# Patient Record
Sex: Male | Born: 1945 | Race: Black or African American | Hispanic: No | Marital: Married | Smoking: Former smoker
Health system: Southern US, Community
[De-identification: ages and names within clinical notes are randomized; demographics above are authoritative.]

## PROBLEM LIST (undated history)

## (undated) DIAGNOSIS — M1990 Unspecified osteoarthritis, unspecified site: Secondary | ICD-10-CM

## (undated) DIAGNOSIS — I10 Essential (primary) hypertension: Secondary | ICD-10-CM

## (undated) DIAGNOSIS — I2699 Other pulmonary embolism without acute cor pulmonale: Secondary | ICD-10-CM

## (undated) HISTORY — PX: JOINT REPLACEMENT: SHX530

## (undated) HISTORY — PX: OTHER SURGICAL HISTORY: SHX169

## (undated) HISTORY — PX: REPLACEMENT TOTAL KNEE BILATERAL: SUR1225

---

## 2018-04-22 ENCOUNTER — Ambulatory Visit
Admission: EM | Admit: 2018-04-22 | Discharge: 2018-04-22 | Disposition: A | Discharge status: Home / Self Care | Payer: TRICARE For Life (TFL) | Attending: Family Medicine | Admitting: Family Medicine

## 2018-04-22 ENCOUNTER — Emergency Department: Payer: Medicare Other

## 2018-04-22 ENCOUNTER — Other Ambulatory Visit: Payer: Self-pay

## 2018-04-22 ENCOUNTER — Inpatient Hospital Stay (HOSPITAL_COMMUNITY)
Admit: 2018-04-22 | Discharge: 2018-04-22 | Disposition: A | Discharge status: Home / Self Care | Payer: Medicare Other | Attending: Internal Medicine | Admitting: Internal Medicine

## 2018-04-22 ENCOUNTER — Encounter: Payer: Self-pay | Admitting: Emergency Medicine

## 2018-04-22 ENCOUNTER — Inpatient Hospital Stay
Admission: EM | Admit: 2018-04-22 | Discharge: 2018-04-24 | DRG: 175 | Disposition: A | Discharge status: Home / Self Care | Payer: Medicare Other | Attending: Internal Medicine | Admitting: Internal Medicine

## 2018-04-22 DIAGNOSIS — I2609 Other pulmonary embolism with acute cor pulmonale: Secondary | ICD-10-CM | POA: Diagnosis present

## 2018-04-22 DIAGNOSIS — Z880 Allergy status to penicillin: Secondary | ICD-10-CM

## 2018-04-22 DIAGNOSIS — Z79899 Other long term (current) drug therapy: Secondary | ICD-10-CM | POA: Diagnosis not present

## 2018-04-22 DIAGNOSIS — R079 Chest pain, unspecified: Secondary | ICD-10-CM | POA: Diagnosis not present

## 2018-04-22 DIAGNOSIS — I82431 Acute embolism and thrombosis of right popliteal vein: Secondary | ICD-10-CM | POA: Diagnosis present

## 2018-04-22 DIAGNOSIS — R0781 Pleurodynia: Secondary | ICD-10-CM

## 2018-04-22 DIAGNOSIS — M1990 Unspecified osteoarthritis, unspecified site: Secondary | ICD-10-CM

## 2018-04-22 DIAGNOSIS — H409 Unspecified glaucoma: Secondary | ICD-10-CM | POA: Diagnosis present

## 2018-04-22 DIAGNOSIS — R071 Chest pain on breathing: Secondary | ICD-10-CM | POA: Diagnosis not present

## 2018-04-22 DIAGNOSIS — I2699 Other pulmonary embolism without acute cor pulmonale: Secondary | ICD-10-CM | POA: Diagnosis not present

## 2018-04-22 DIAGNOSIS — I10 Essential (primary) hypertension: Secondary | ICD-10-CM | POA: Diagnosis present

## 2018-04-22 DIAGNOSIS — Z86711 Personal history of pulmonary embolism: Secondary | ICD-10-CM

## 2018-04-22 DIAGNOSIS — Z881 Allergy status to other antibiotic agents status: Secondary | ICD-10-CM

## 2018-04-22 DIAGNOSIS — R1011 Right upper quadrant pain: Secondary | ICD-10-CM | POA: Diagnosis not present

## 2018-04-22 HISTORY — DX: Essential (primary) hypertension: I10

## 2018-04-22 HISTORY — DX: Other pulmonary embolism without acute cor pulmonale: I26.99

## 2018-04-22 HISTORY — DX: Unspecified osteoarthritis, unspecified site: M19.90

## 2018-04-22 LAB — CBC
HCT: 46.2 % (ref 39.0–52.0)
Hemoglobin: 15.3 g/dL (ref 13.0–17.0)
MCH: 30.1 pg (ref 26.0–34.0)
MCHC: 33.1 g/dL (ref 30.0–36.0)
MCV: 90.9 fL (ref 80.0–100.0)
NRBC: 0 % (ref 0.0–0.2)
PLATELETS: 170 10*3/uL (ref 150–400)
RBC: 5.08 MIL/uL (ref 4.22–5.81)
RDW: 12.3 % (ref 11.5–15.5)
WBC: 6.6 10*3/uL (ref 4.0–10.5)

## 2018-04-22 LAB — COMPREHENSIVE METABOLIC PANEL
ALK PHOS: 73 U/L (ref 38–126)
ALT: 17 U/L (ref 0–44)
ANION GAP: 11 (ref 5–15)
AST: 18 U/L (ref 15–41)
Albumin: 4.4 g/dL (ref 3.5–5.0)
BILIRUBIN TOTAL: 1.1 mg/dL (ref 0.3–1.2)
BUN: 15 mg/dL (ref 8–23)
CALCIUM: 9.6 mg/dL (ref 8.9–10.3)
CO2: 28 mmol/L (ref 22–32)
CREATININE: 0.97 mg/dL (ref 0.61–1.24)
Chloride: 99 mmol/L (ref 98–111)
Glucose, Bld: 105 mg/dL — ABNORMAL HIGH (ref 70–99)
Potassium: 4.2 mmol/L (ref 3.5–5.1)
Sodium: 138 mmol/L (ref 135–145)
TOTAL PROTEIN: 8.5 g/dL — AB (ref 6.5–8.1)

## 2018-04-22 LAB — HEPARIN LEVEL (UNFRACTIONATED)
Heparin Unfractionated: 1.2 IU/mL — ABNORMAL HIGH (ref 0.30–0.70)
Heparin Unfractionated: 1.13 IU/mL — ABNORMAL HIGH (ref 0.30–0.70)

## 2018-04-22 LAB — PROTIME-INR
INR: 1.07
Prothrombin Time: 13.8 seconds (ref 11.4–15.2)

## 2018-04-22 LAB — TROPONIN I: Troponin I: <0.03 ng/mL (ref <0.03)

## 2018-04-22 LAB — LIPASE, BLOOD: Lipase: 32 U/L (ref 11–51)

## 2018-04-22 LAB — BRAIN NATRIURETIC PEPTIDE: B Natriuretic Peptide: 10 pg/mL (ref 0.0–100.0)

## 2018-04-22 LAB — APTT: APTT: 27 s (ref 24–36)

## 2018-04-22 MED ORDER — SODIUM CHLORIDE 0.9 % IV BOLUS
1000.0000 mL | Freq: Once | INTRAVENOUS | Status: AC
  Administered 2018-04-22: 1000 mL via INTRAVENOUS

## 2018-04-22 MED ORDER — OXYCODONE-ACETAMINOPHEN 5-325 MG PO TABS
1.0000 | ORAL_TABLET | ORAL | Status: DC | PRN
  Administered 2018-04-22: 1 via ORAL
  Filled 2018-04-22: qty 1

## 2018-04-22 MED ORDER — OXYCODONE-ACETAMINOPHEN 5-325 MG PO TABS
1.0000 | ORAL_TABLET | ORAL | Status: DC | PRN
  Administered 2018-04-22 – 2018-04-24 (×5): 1 via ORAL
  Filled 2018-04-22 (×5): qty 1

## 2018-04-22 MED ORDER — LATANOPROST 0.005 % OP SOLN
1.0000 [drp] | Freq: Every day | OPHTHALMIC | Status: DC
  Administered 2018-04-22 – 2018-04-23 (×2): 1 [drp] via OPHTHALMIC
  Filled 2018-04-22 (×2): qty 2.5

## 2018-04-22 MED ORDER — ONDANSETRON HCL 4 MG/2ML IJ SOLN
4.0000 mg | Freq: Once | INTRAMUSCULAR | Status: AC
  Administered 2018-04-22: 4 mg via INTRAVENOUS
  Filled 2018-04-22 (×2): qty 2

## 2018-04-22 MED ORDER — DOCUSATE SODIUM 100 MG PO CAPS: 100.0000 mg | ORAL_CAPSULE | Freq: Two times a day (BID) | ORAL | Status: DC | PRN

## 2018-04-22 MED ORDER — HEPARIN BOLUS VIA INFUSION
5000.0000 [IU] | Freq: Once | INTRAVENOUS | Status: AC
  Administered 2018-04-22: 5000 [IU] via INTRAVENOUS
  Filled 2018-04-22: qty 5000

## 2018-04-22 MED ORDER — HEPARIN (PORCINE) IN NACL 100-0.45 UNIT/ML-% IJ SOLN
1500.0000 [IU]/h | INTRAMUSCULAR | Status: DC
  Administered 2018-04-22: 1500 [IU]/h via INTRAVENOUS
  Filled 2018-04-22 (×2): qty 250

## 2018-04-22 MED ORDER — POLYVINYL ALCOHOL 1.4 % OP SOLN
1.0000 [drp] | OPHTHALMIC | Status: DC | PRN
  Filled 2018-04-22: qty 15

## 2018-04-22 MED ORDER — MORPHINE SULFATE (PF) 4 MG/ML IV SOLN
INTRAVENOUS | Status: AC
  Filled 2018-04-22: qty 1

## 2018-04-22 MED ORDER — HEPARIN (PORCINE) IN NACL 100-0.45 UNIT/ML-% IJ SOLN
1200.0000 [IU]/h | INTRAMUSCULAR | Status: DC
  Administered 2018-04-22 – 2018-04-24 (×3): 1200 [IU]/h via INTRAVENOUS
  Filled 2018-04-22 (×2): qty 250

## 2018-04-22 MED ORDER — SENNOSIDES-DOCUSATE SODIUM 8.6-50 MG PO TABS
1.0000 | ORAL_TABLET | Freq: Two times a day (BID) | ORAL | Status: DC
  Administered 2018-04-22 – 2018-04-24 (×4): 1 via ORAL
  Filled 2018-04-22 (×4): qty 1

## 2018-04-22 MED ORDER — IOPAMIDOL (ISOVUE-370) INJECTION 76%
75.0000 mL | Freq: Once | INTRAVENOUS | Status: AC | PRN
  Administered 2018-04-22: 75 mL via INTRAVENOUS

## 2018-04-22 MED ORDER — MORPHINE SULFATE (PF) 4 MG/ML IV SOLN
4.0000 mg | INTRAVENOUS | Status: DC | PRN
  Administered 2018-04-22: 4 mg via INTRAVENOUS
  Filled 2018-04-22 (×2): qty 1

## 2018-04-22 MED ORDER — MORPHINE SULFATE (PF) 4 MG/ML IV SOLN
4.0000 mg | INTRAVENOUS | Status: DC | PRN
  Administered 2018-04-22 – 2018-04-23 (×2): 4 mg via INTRAVENOUS
  Filled 2018-04-22 (×2): qty 1

## 2018-04-22 NOTE — ED Notes (Signed)
Pt c/o RUQ pain x1 day - he reports pain increases with deep inspiration or sneezing - pt denies any injury Denies N/V/D

## 2018-04-22 NOTE — H&P (Signed)
[facility] - [place] at [facility]   PATIENT NAME: Tyrone Coffey    MR#:  [id number]  DATE OF BIRTH:  05-31-1946  DATE OF ADMISSION:  04/22/2018  PRIMARY CARE PHYSICIAN: Tammi Klippel, MD   REQUESTING/REFERRING PHYSICIAN: Roxan Hockey  CHIEF COMPLAINT:   Chief Complaint  Patient presents with  . Abdominal Pain    HISTORY OF PRESENT ILLNESS: Tyrone Coffey  is a 72 y.o. male with a known history of arthritis, hypertension, pulmonary embolism in 2013 and no provoking reasons were found at that time-was on Coumadin for 3 to 4 months following that and stopped after that.  Had regular colonoscopies and last was 1 year ago without any significant findings. He had pleuritic chest pain on the right side for last 2 to 3 days along with some cough-so he went to urgent care center today and they sent to the hospital due to history of pulmonary embolism. CT scan angiogram was done which showed multiple pulmonary emboli in both lungs with right heart strain.  ER physician started on heparin drip and advised to medical services. On further questioning patient denies any family history of blood clots, denies any recent surgery or long-term travel or driving.  He confirms that he is "couch potato" sits and watch TV 3 to 4 hours every day without getting up in between.  PAST MEDICAL HISTORY:   Past Medical History:  Diagnosis Date  . Arthritis   . Hypertension   . Pulmonary embolism (HCC)     PAST SURGICAL HISTORY:  Past Surgical History:  Procedure Laterality Date  . JOINT REPLACEMENT      SOCIAL HISTORY:  Social History   Tobacco Use  . Smoking status: Never Smoker  . Smokeless tobacco: Never Used  Substance Use Topics  . Alcohol use: Yes    FAMILY HISTORY:  Family History  Problem Relation Age of Onset  . Lung cancer Brother     DRUG ALLERGIES:  Allergies  Allergen Reactions  . Amoxicillin Nausea Only    Has patient had a PCN reaction causing immediate  rash, facial/tongue/throat swelling, SOB or lightheadedness with hypotension: No Has patient had a PCN reaction causing severe rash involving mucus membranes or skin necrosis: No Has patient had a PCN reaction that required hospitalization: No Has patient had a PCN reaction occurring within the last 10 years: Yes If all of the above answers are "NO", then may proceed with Cephalosporin use.    REVIEW OF SYSTEMS:   CONSTITUTIONAL: No fever, fatigue or weakness.  EYES: No blurred or double vision.  EARS, NOSE, AND THROAT: No tinnitus or ear pain.  RESPIRATORY: Have cough, shortness of breath, no wheezing or hemoptysis.  CARDIOVASCULAR: Right-sided chest pain, no orthopnea, edema.  GASTROINTESTINAL: No nausea, vomiting, diarrhea or abdominal pain.  GENITOURINARY: No dysuria, hematuria.  ENDOCRINE: No polyuria, nocturia,  HEMATOLOGY: No anemia, easy bruising or bleeding SKIN: No rash or lesion. MUSCULOSKELETAL: No joint pain or arthritis.   NEUROLOGIC: No tingling, numbness, weakness.  PSYCHIATRY: No anxiety or depression.   MEDICATIONS AT HOME:  Prior to Admission medications   Medication Sig Start Date End Date Taking? Authorizing Provider  Dextran 70-Hypromellose (ARTIFICIAL TEARS) 0.1-0.3 % SOLN Place 1 drop into both eyes every 4 (four) hours as needed (for dry eyes).    Yes [provider]  latanoprost (XALATAN) 0.005 % ophthalmic solution Place 1 drop into the right eye at bedtime.    Yes [provider]  lisinopril-hydrochlorothiazide (PRINZIDE,ZESTORETIC) 10-12.5 MG  tablet Take 1 tablet by mouth daily.    Yes [provider]      PHYSICAL EXAMINATION:   VITAL SIGNS: Blood pressure (!) 130/99, pulse 84, temperature 98.2 F (36.8 C), temperature source Oral, resp. rate (!) 23, height 5\' 11"  (1.803 m), weight 86.2 kg, SpO2 95 %.  GENERAL:  72 y.o.-year-old patient lying in the bed with no acute distress.  EYES: Pupils equal, round, reactive to light  and accommodation. No scleral icterus. Extraocular muscles intact.  HEENT: Head atraumatic, normocephalic. Oropharynx and nasopharynx clear.  NECK:  Supple, no jugular venous distention. No thyroid enlargement, no tenderness.  LUNGS: Normal breath sounds bilaterally, no wheezing, rales,rhonchi or crepitation. No use of accessory muscles of respiration.  CARDIOVASCULAR: S1, S2 normal. No murmurs, rubs, or gallops.  ABDOMEN: Soft, nontender, nondistended. Bowel sounds present. No organomegaly or mass.  EXTREMITIES: No pedal edema, cyanosis, or clubbing.  NEUROLOGIC: Cranial nerves II through XII are intact. Muscle strength 5/5 in all extremities. Sensation intact. Gait not checked.  PSYCHIATRIC: The patient is alert and oriented x 3.  SKIN: No obvious rash, lesion, or ulcer.   LABORATORY PANEL:   CBC Recent Labs  Lab 04/22/18 0950  WBC 6.6  HGB 15.3  HCT 46.2  PLT 170  MCV 90.9  MCH 30.1  MCHC 33.1  RDW 12.3   ------------------------------------------------------------------------------------------------------------------  Chemistries  Recent Labs  Lab 04/22/18 0950  NA 138  K 4.2  CL 99  CO2 28  GLUCOSE 105*  BUN 15  CREATININE 0.97  CALCIUM 9.6  AST 18  ALT 17  ALKPHOS 73  BILITOT 1.1   ------------------------------------------------------------------------------------------------------------------ estimated creatinine clearance is 74.4 mL/min (by C-G formula based on SCr of 0.97 mg/dL). ------------------------------------------------------------------------------------------------------------------ No results for input(s): TSH, T4TOTAL, T3FREE, THYROIDAB in the last 72 hours.  Invalid input(s): FREET3   Coagulation profile Recent Labs  Lab 04/22/18 1145  INR 1.07   ------------------------------------------------------------------------------------------------------------------- No results for input(s): DDIMER in the last 72  hours. -------------------------------------------------------------------------------------------------------------------  Cardiac Enzymes Recent Labs  Lab 04/22/18 0950 04/22/18 1145  TROPONINI <0.03 <0.03   ------------------------------------------------------------------------------------------------------------------ Invalid input(s): POCBNP  ---------------------------------------------------------------------------------------------------------------  Urinalysis No results found for: COLORURINE, APPEARANCEUR, LABSPEC, PHURINE, GLUCOSEU, HGBUR, BILIRUBINUR, KETONESUR, PROTEINUR, UROBILINOGEN, NITRITE, LEUKOCYTESUR   RADIOLOGY: Ct Angio Chest Pe W And/or Wo Contrast  Result Date: 04/22/2018 CLINICAL DATA:  Shortness of breath. Right upper quadrant abdomen pain. Clinical concern for pulmonary embolism. EXAM: CT ANGIOGRAPHY CHEST WITH CONTRAST TECHNIQUE: Multidetector CT imaging of the chest was performed using the standard protocol during bolus administration of intravenous contrast. Multiplanar CT image reconstructions and MIPs were obtained to evaluate the vascular anatomy. CONTRAST:  75mL ISOVUE-370 IOPAMIDOL (ISOVUE-370) INJECTION 76% COMPARISON:  Portable chest obtained earlier today. FINDINGS: Cardiovascular: Multiple moderate-sized bilateral pulmonary arterial filling defects. The right ventricular to left ventricular ratio is 1.2. Borderline enlarged heart. No pericardial effusion seen. Mediastinum/Nodes: No enlarged mediastinal, hilar, or axillary lymph nodes. Thyroid gland, trachea, and esophagus demonstrate no significant findings. Small hiatal hernia. Lungs/Pleura: Mild bilateral lower lobe dependent atelectasis. No pleural fluid. Upper Abdomen: Bilateral renal cysts. Musculoskeletal: Thoracic spine degenerative changes. Right shoulder prosthesis. Review of the MIP images confirms the above findings. IMPRESSION: 1. Positive for acute PE with CT evidence of right heart strain  (RV/LV Ratio = 1.2) consistent with at least submassive (intermediate risk) PE. The presence of right heart strain has been associated with an increased risk of morbidity and mortality. Please activate Code PE by paging [phone]. 2. Small hiatal hernia. Critical  Value/emergent results were called by telephone at the time of interpretation on 04/22/2018 at 11:13 am to Dr. Willy Eddy , who verbally acknowledged these results. Electronically Signed   By: Beckie Salts M.D.   On: 04/22/2018 11:14   Dg Chest Portable 1 View  Result Date: 04/22/2018 CLINICAL DATA:  Right side chest pain. EXAM: PORTABLE CHEST 1 VIEW COMPARISON:  None. FINDINGS: Cardiomegaly with vascular congestion. Low lung volumes with bibasilar atelectasis. No visible significant effusions or acute bony abnormality. IMPRESSION: Low lung volumes with bibasilar atelectasis. Mild cardiomegaly and vascular congestion. Electronically Signed   By: Charlett Nose M.D.   On: 04/22/2018 10:30    EKG: Orders placed or performed during the hospital encounter of 04/22/18  . EKG 12-Lead  . EKG 12-Lead    IMPRESSION AND PLAN:  *Acute multiple pulmonary emboli This is unprovoked.  Started on heparin IV drip, hematology consult.  *Right heart strain Will get echocardiogram.  *Pleuritic chest pain Due to pulmonary emboli Helped with IV morphine in ER, will start on oral Percocet.  *Hypertension As currently blood pressure running normal and he had right heart strain, I will hold antihypertensive medications.  We may resume once the blood pressure started going up.  All the records are reviewed and case discussed with ED provider. Management plans discussed with the patient, family and they are in agreement.  CODE STATUS: Full code.  Patient's wife was present in the room during my visit explained her and patient in detail about causes and treatment options for pulmonary emboli.  TOTAL TIME TAKING CARE OF THIS PATIENT: 45  minutes.    Altamese Dilling M.D on 04/22/2018   Between 7am to 6pm - Pager - [phone]  After 6pm go to [email or web address] - Social research officer, government  [facility]  Office  [phone]  CC: Primary care physician; Sotolongo, Christiana Pellant, MD   Note: This dictation was prepared with Dragon dictation along with smaller phrase technology. Any transcriptional errors that result from this process are unintentional.

## 2018-04-22 NOTE — Progress Notes (Signed)
ANTICOAGULATION CONSULT NOTE - Initial Consult  Pharmacy Consult for Heparin Drip Indication: VTE treatment  Allergies  Allergen Reactions  . Amoxicillin Nausea Only    Has patient had a PCN reaction causing immediate rash, facial/tongue/throat swelling, SOB or lightheadedness with hypotension: No Has patient had a PCN reaction causing severe rash involving mucus membranes or skin necrosis: No Has patient had a PCN reaction that required hospitalization: No Has patient had a PCN reaction occurring within the last 10 years: Yes If all of the above answers are "NO", then may proceed with Cephalosporin use.    Patient Measurements: Height: 5\' 11"  (180.3 cm) Weight: 198 lb 10.2 oz (90.1 kg) IBW/kg (Calculated) : 75.3 Heparin Dosing Weight: 86.2 kg  Vital Signs: Temp: 98.7 F (37.1 C) (10/30 1946) Temp Source: Oral (10/30 1946) BP: 118/89 (10/30 1946) Pulse Rate: 64 (10/30 1946)  Labs: Recent Labs    04/22/18 0950 04/22/18 1145 04/22/18 1951  HGB 15.3  --   --   HCT 46.2  --   --   PLT 170  --   --   APTT  --  27  --   LABPROT  --  13.8  --   INR  --  1.07  --   HEPARINUNFRC  --   --  1.20*  CREATININE 0.97  --   --   TROPONINI <0.03 <0.03  --     Estimated Creatinine Clearance: 74.4 mL/min (by C-G formula based on SCr of 0.97 mg/dL).   Medical History: Past Medical History:  Diagnosis Date  . Arthritis   . Hypertension   . Pulmonary embolism (HCC)     Medications:  Scheduled:  . latanoprost  1 drop Right Eye QHS  . morphine      . senna-docusate  1 tablet Oral BID   Infusions:  . heparin 1,500 Units/hr (04/22/18 2002)    Assessment: Pharmacy consulted to dose and monitor heparin drip for this 72 yo male presenting to ED with c/o upper abdominal pain that started yesterday.    Goal of Therapy:  Heparin level 0.3-0.7 units/ml Monitor platelets by anticoagulation protocol: Yes   Plan:  Give 5000 units bolus x 1 Start heparin infusion at 1500  units/hr Check anti-Xa level in 8 hours and daily while on heparin Continue to monitor H&H and platelets  HL ordered for tonight at 20:00.   10/30:  HL @ 20:00 = 1.2 Suspect this may be lab error or sample was drawn incorrectly.   Will repeat HL on 10/30 @ 21:45.  Kalyan Barabas D, RPH 04/22/2018,9:35 PM

## 2018-04-22 NOTE — ED Triage Notes (Signed)
Pt c/o RUQ pain since yesterday with , hurts with deep breathing . Denies injury. Denies N/V/D. Pt appears to be uncomfortable, grimacing.

## 2018-04-22 NOTE — ED Provider Notes (Signed)
[facility]    CSN: [id number] Arrival date & time: 04/22/18  0802     History   Chief Complaint Chief Complaint  Patient presents with  . Abdominal Pain    HPI Tyrone Coffey is a 72 y.o. male.   72 yo male with a c/o progressively worsening right upper quadrant pain since yesterday. Patient states pain is worse when taking deep breaths and feels slight shortness of breath. Patient denies any nausea, vomiting, fevers, chills. Patient has a h/o pulmonary embolism in 2013.   The history is provided by the patient.    Past Medical History:  Diagnosis Date  . Arthritis   . Hypertension   . Pulmonary embolism (HCC)     There are no active problems to display for this patient.   Past Surgical History:  Procedure Laterality Date  . JOINT REPLACEMENT         Home Medications    Prior to Admission medications   Medication Sig Start Date End Date Taking? Authorizing Provider  lisinopril-hydrochlorothiazide (PRINZIDE,ZESTORETIC) 10-12.5 MG tablet Take by mouth. 01/09/18  Yes [provider]  Dextran 70-Hypromellose (ARTIFICIAL TEARS) 0.1-0.3 % SOLN 1 drop every 4 (four) hours as needed for Dry Eyes.    [provider]    Family History History reviewed. No pertinent family history.  Social History Social History   Tobacco Use  . Smoking status: Never Smoker  . Smokeless tobacco: Never Used  Substance Use Topics  . Alcohol use: Yes  . Drug use: Never     Allergies   Amoxicillin   Review of Systems Review of Systems   Physical Exam Triage Vital Signs ED Triage Vitals  Enc Vitals Group     BP 04/22/18 0812 (!) 137/104     Pulse Rate 04/22/18 0812 90     Resp 04/22/18 0812 18     Temp 04/22/18 0812 98.2 F (36.8 C)     Temp Source 04/22/18 0812 Oral     SpO2 04/22/18 0812 97 %     Weight 04/22/18 0815 190 lb (86.2 kg)     Height 04/22/18 0815 5\' 11"  (1.803 m)     Head Circumference --      Peak Flow --      Pain  Score 04/22/18 0815 9     Pain Loc --      Pain Edu? --      Excl. in GC? --    No data found.  Updated Vital Signs BP (!) 137/104 (BP Location: Left Arm)   Pulse 90   Temp 98.2 F (36.8 C) (Oral)   Resp 18   Ht 5\' 11"  (1.803 m)   Wt 86.2 kg   SpO2 97%   BMI 26.50 kg/m   Visual Acuity Right Eye Distance:   Left Eye Distance:   Bilateral Distance:    Right Eye Near:   Left Eye Near:    Bilateral Near:     Physical Exam  Constitutional: He is oriented to person, place, and time. He appears well-developed and well-nourished. No distress.  HENT:  Head: Normocephalic and atraumatic.  Cardiovascular: Normal rate, regular rhythm, normal heart sounds and intact distal pulses.  No murmur heard. Pulmonary/Chest: Effort normal and breath sounds normal. No stridor. No respiratory distress. He has no wheezes. He has no rales.  Abdominal: Soft. Bowel sounds are normal. He exhibits no distension and no mass. There is tenderness (right upper quadrant). There is rebound. There is no guarding.  Neurological: He is alert and oriented to person, place, and time.  Skin: No rash noted. He is not diaphoretic.  Nursing note and vitals reviewed.    UC Treatments / Results  Labs (all labs ordered are listed, but only abnormal results are displayed) Labs Reviewed - No data to display  EKG None  Radiology No results found.  Procedures Procedures (including critical care time)  Medications Ordered in UC Medications - No data to display  Initial Impression / Assessment and Plan / UC Course  I have reviewed the triage vital signs and the nursing notes.  Pertinent labs & imaging results that were available during my care of the patient were reviewed by me and considered in my medical decision making (see chart for details).      Final Clinical Impressions(s) / UC Diagnoses   Final diagnoses:  Pleuritic chest pain  Abdominal pain, right upper quadrant  History of pulmonary  embolism     Discharge Instructions     Recommend patient go to Emergency Department for further evaluation and management    ED Prescriptions    None      1. Possible diagnoses reviewed with patient;explained to patient recommend he go to Emergency Department for further evaluation and management   Controlled Substance Prescriptions [place] Controlled Substance Registry consulted? Not Applicable   Payton Mccallum, MD 04/22/18 [phone]

## 2018-04-22 NOTE — ED Notes (Signed)
Attending made aware in moving and intensified pain and respiration rate

## 2018-04-22 NOTE — Discharge Instructions (Signed)
Recommend patient go to Emergency Department for further evaluation and management °

## 2018-04-22 NOTE — ED Notes (Signed)
First Nurse Note: Patient states he was sent from [facility].  Complaining of right sided abdominal pain starting yesterday PM.  Splinting his side.  Assisted into WC.

## 2018-04-22 NOTE — ED Triage Notes (Signed)
Patient c/o upper abdominal pain that started yesterday. Patient stated when he takes a deep breath the pain intensifies. Patient reports he has had a blood clot before in the past.

## 2018-04-22 NOTE — ED Notes (Signed)
Pt c/o increased pain with inspiration. O2 93% at this time. Attending paged.

## 2018-04-22 NOTE — Progress Notes (Signed)
ANTICOAGULATION CONSULT NOTE - Initial Consult  Pharmacy Consult for Heparin Drip Indication: VTE treatment  Allergies  Allergen Reactions  . Amoxicillin Nausea Only    Has patient had a PCN reaction causing immediate rash, facial/tongue/throat swelling, SOB or lightheadedness with hypotension: No Has patient had a PCN reaction causing severe rash involving mucus membranes or skin necrosis: No Has patient had a PCN reaction that required hospitalization: No Has patient had a PCN reaction occurring within the last 10 years: Yes If all of the above answers are "NO", then may proceed with Cephalosporin use.    Patient Measurements: Height: 5\' 11"  (180.3 cm) Weight: 198 lb 10.2 oz (90.1 kg) IBW/kg (Calculated) : 75.3 Heparin Dosing Weight: 86.2 kg  Vital Signs: Temp: 98.7 F (37.1 C) (10/30 1946) Temp Source: Oral (10/30 1946) BP: 118/89 (10/30 1946) Pulse Rate: 64 (10/30 1946)  Labs: Recent Labs    04/22/18 0950 04/22/18 1145 04/22/18 1951 04/22/18 2148  HGB 15.3  --   --   --   HCT 46.2  --   --   --   PLT 170  --   --   --   APTT  --  27  --   --   LABPROT  --  13.8  --   --   INR  --  1.07  --   --   HEPARINUNFRC  --   --  1.20* 1.13*  CREATININE 0.97  --   --   --   TROPONINI <0.03 <0.03  --   --     Estimated Creatinine Clearance: 74.4 mL/min (by C-G formula based on SCr of 0.97 mg/dL).   Medical History: Past Medical History:  Diagnosis Date  . Arthritis   . Hypertension   . Pulmonary embolism (HCC)     Medications:  Scheduled:  . latanoprost  1 drop Right Eye QHS  . morphine      . senna-docusate  1 tablet Oral BID   Infusions:  . [START ON 04/23/2018] heparin      Assessment: Pharmacy consulted to dose and monitor heparin drip for this 72 yo male presenting to ED with c/o upper abdominal pain that started yesterday.    Goal of Therapy:  Heparin level 0.3-0.7 units/ml Monitor platelets by anticoagulation protocol: Yes   Plan:  Give 5000  units bolus x 1 Start heparin infusion at 1500 units/hr Check anti-Xa level in 8 hours and daily while on heparin Continue to monitor H&H and platelets  HL ordered for tonight at 20:00.   10/30:  HL @ 20:00 = 1.2 Suspect this may be lab error or sample was drawn incorrectly.   Will repeat HL on 10/30 @ 21:45. Will hold Heparin gtt for 1 hr pending lab redraw.  Called RN and told her to stop heparin gtt.  Will decrease dose to 1200 units/hr if 2nd draw validates 1st lab result.  If 2nd HL is therapeutic resume heparin gtt @ 1500 units/hr.   10/30: HL @ 21:48 = 1.13 Will restart heparin gtt @ 1200 units/hr on 10/31 @ 00:00. Will draw next HL on 10/31 @ 0800.   Sumit Branham D, RPH 04/22/2018,10:29 PM

## 2018-04-22 NOTE — ED Provider Notes (Signed)
[facility] Emergency Department Provider Note    First MD Initiated Contact with Patient 04/22/18 [phone]     (approximate)  I have reviewed the triage vital signs and the nursing notes.   HISTORY  Chief Complaint Abdominal Pain    HPI Tyrone Coffey is a 72 y.o. male history of PE not on anticoagulation presents the ER with chief complaint of right upper quadrant and right lower chest pain started yesterday.  Pain is worsened by taking deep inspiration.  Denies any measured fevers.  No nausea or vomiting.  States it feels similar to previous PE.  States the pain is mild to moderate in severity.  No recent surgeries or prolonged travel.    Past Medical History:  Diagnosis Date  . Arthritis   . Hypertension   . Pulmonary embolism (HCC)    Family History  Problem Relation Age of Onset  . Lung cancer Brother    Past Surgical History:  Procedure Laterality Date  . JOINT REPLACEMENT     Patient Active Problem List   Diagnosis Date Noted  . Pulmonary embolism (HCC) 04/22/2018      Prior to Admission medications   Medication Sig Start Date End Date Taking? Authorizing Provider  Dextran 70-Hypromellose (ARTIFICIAL TEARS) 0.1-0.3 % SOLN Place 1 drop into both eyes every 4 (four) hours as needed (for dry eyes).    Yes [provider]  latanoprost (XALATAN) 0.005 % ophthalmic solution Place 1 drop into the right eye at bedtime.    Yes [provider]  lisinopril-hydrochlorothiazide (PRINZIDE,ZESTORETIC) 10-12.5 MG tablet Take 1 tablet by mouth daily.    Yes [provider]    Allergies Amoxicillin    Social History Social History   Tobacco Use  . Smoking status: Never Smoker  . Smokeless tobacco: Never Used  Substance Use Topics  . Alcohol use: Yes  . Drug use: Never    Review of Systems Patient denies headaches, rhinorrhea, blurry vision, numbness, shortness of breath, chest pain, edema, cough, abdominal pain,  nausea, vomiting, diarrhea, dysuria, fevers, rashes or hallucinations unless otherwise stated above in HPI. ____________________________________________   PHYSICAL EXAM:  VITAL SIGNS: Vitals:   04/22/18 1300 04/22/18 1330  BP: 110/74 113/84  Pulse: (!) 50 (!) 52  Resp: (!) 21 15  Temp:    SpO2: 96% 93%    Constitutional: Alert and oriented.  Eyes: Conjunctivae are normal.  Head: Atraumatic. Nose: No congestion/rhinnorhea. Mouth/Throat: Mucous membranes are moist.   Neck: No stridor. Painless ROM.  Cardiovascular: Normal rate, regular rhythm. Grossly normal heart sounds.  Good peripheral circulation. Respiratory: Normal respiratory effort.  No retractions. Lungs with rll inspiratory crackles noted Gastrointestinal: Soft and nontender. No distention. No abdominal bruits. No CVA tenderness. Genitourinary:  Musculoskeletal: No lower extremity tenderness nor edema.  No joint effusions. Neurologic:  Normal speech and language. No gross focal neurologic deficits are appreciated. No facial droop Skin:  Skin is warm, dry and intact. No rash noted. Psychiatric: Mood and affect are normal. Speech and behavior are normal.  ____________________________________________   LABS (all labs ordered are listed, but only abnormal results are displayed)  Results for orders placed or performed during the hospital encounter of 04/22/18 (from the past 24 hour(s))  Lipase, blood     Status: None   Collection Time: 04/22/18  9:50 AM  Result Value Ref Range   Lipase 32 11 - 51 U/L  Comprehensive metabolic panel     Status: Abnormal   Collection Time:  04/22/18  9:50 AM  Result Value Ref Range   Sodium 138 135 - 145 mmol/L   Potassium 4.2 3.5 - 5.1 mmol/L   Chloride 99 98 - 111 mmol/L   CO2 28 22 - 32 mmol/L   Glucose, Bld 105 (H) 70 - 99 mg/dL   BUN 15 8 - 23 mg/dL   Creatinine, Ser 4.09 0.61 - 1.24 mg/dL   Calcium 9.6 8.9 - 81.1 mg/dL   Total Protein 8.5 (H) 6.5 - 8.1 g/dL   Albumin 4.4 3.5  - 5.0 g/dL   AST 18 15 - 41 U/L   ALT 17 0 - 44 U/L   Alkaline Phosphatase 73 38 - 126 U/L   Total Bilirubin 1.1 0.3 - 1.2 mg/dL   GFR calc non Af Amer >60 >60 mL/min   GFR calc Af Amer >60 >60 mL/min   Anion gap 11 5 - 15  CBC     Status: None   Collection Time: 04/22/18  9:50 AM  Result Value Ref Range   WBC 6.6 4.0 - 10.5 K/uL   RBC 5.08 4.22 - 5.81 MIL/uL   Hemoglobin 15.3 13.0 - 17.0 g/dL   HCT 91.4 78.2 - 95.6 %   MCV 90.9 80.0 - 100.0 fL   MCH 30.1 26.0 - 34.0 pg   MCHC 33.1 30.0 - 36.0 g/dL   RDW 21.3 08.6 - 57.8 %   Platelets 170 150 - 400 K/uL   nRBC 0.0 0.0 - 0.2 %  Troponin I     Status: None   Collection Time: 04/22/18  9:50 AM  Result Value Ref Range   Troponin I <0.03 <0.03 ng/mL  Brain natriuretic peptide     Status: None   Collection Time: 04/22/18  9:56 AM  Result Value Ref Range   B Natriuretic Peptide 10.0 0.0 - 100.0 pg/mL  Protime-INR     Status: None   Collection Time: 04/22/18 11:45 AM  Result Value Ref Range   Prothrombin Time 13.8 11.4 - 15.2 seconds   INR 1.07   APTT     Status: None   Collection Time: 04/22/18 11:45 AM  Result Value Ref Range   aPTT 27 24 - 36 seconds  Troponin I     Status: None   Collection Time: 04/22/18 11:45 AM  Result Value Ref Range   Troponin I <0.03 <0.03 ng/mL   ____________________________________________  EKG My review and personal interpretation at Time: 11:50   Indication: chest pain  Rate: 80  Rhythm: sinus Axis: normal Other: normal intervals, no stemi, abn ekg ____________________________________________  RADIOLOGY  I personally reviewed all radiographic images ordered to evaluate for the above acute complaints and reviewed radiology reports and findings.  These findings were personally discussed with the patient.  Please see medical record for radiology report.  ____________________________________________   PROCEDURES  Procedure(s) performed:  .Critical Care Performed by: Willy Eddy,  MD Authorized by: Willy Eddy, MD   Critical care provider statement:    Critical care time (minutes):  38   Critical care time was exclusive of:  Separately billable procedures and treating other patients   Critical care was necessary to treat or prevent imminent or life-threatening deterioration of the following conditions:  Circulatory failure   Critical care was time spent personally by me on the following activities:  Development of treatment plan with patient or surrogate, discussions with consultants, evaluation of patient's response to treatment, examination of patient, obtaining history from patient or surrogate, ordering and  performing treatments and interventions, ordering and review of laboratory studies, ordering and review of radiographic studies, pulse oximetry, re-evaluation of patient's condition and review of old charts      Critical Care performed: yes ____________________________________________   INITIAL IMPRESSION / ASSESSMENT AND PLAN / ED COURSE  Pertinent labs & imaging results that were available during my care of the patient were reviewed by me and considered in my medical decision making (see chart for details).   DDX: pna, ptx, pe, cholelithiasis, cholecystitis  Tyrone Coffey is a 72 y.o. who presents to the ED with symptoms as described above.  Patient not tachycardic mildly hypertensive.  No respiratory distress but does seem to be very uncomfortable.  Given his history I am certainly concern for recurrent PE and will order CT angiogram after chest x-ray confirms that there is no evidence of pneumothorax that could also be causing this presentation.  Will obtain IV access.  Will provide IV fluids as well as IV pain medication.  The patient will be placed on continuous pulse oximetry and telemetry for monitoring.  Laboratory evaluation will be sent to evaluate for the above complaints.     Clinical Course as of Apr 22 1358  Wed Apr 22, 2018  1120  Patient with evidence of PE with right heart strain.  No syncopal episode seemed dynamically stable.  Will initiate heparin.  Patient will require hospitalization.  Will consult the PE team at [facility].   [PR]  1155 Discussed case with PE team at [facility].  No indication for intervention.  We will continue with medical management.  Patient not meeting any criteriA for thrombolysis at this time.   [PR]    Clinical Course User Index [PR] Willy Eddy, MD     As part of my medical decision making, I reviewed the following data within the electronic MEDICAL RECORD NUMBER Nursing notes reviewed and incorporated, Labs reviewed, notes from prior ED visits and [place] Controlled Substance Database   ____________________________________________   FINAL CLINICAL IMPRESSION(S) / ED DIAGNOSES  Final diagnoses:  Other acute pulmonary embolism with acute cor pulmonale (HCC)  Chest pain, unspecified type      NEW MEDICATIONS STARTED DURING THIS VISIT:  New Prescriptions   No medications on file     Note:  This document was prepared using Dragon voice recognition software and may include unintentional dictation errors.    Willy Eddy, MD 04/22/18 [id number]

## 2018-04-22 NOTE — Consult Note (Signed)
Hematology/Oncology Consult note [facility] Telephone:([phone] [phone]  Patient Care Team: Tammi Klippel, MD as PCP - General (Family Medicine)   Name of the patient: Tyrone Coffey  [id number]  [birth year]   Date of visit: 04/22/18 REASON FOR COSULTATION:  Unprovoked PE History of presenting illness-  72 y.o. male with PMH listed at below who presents to ER for evaluation of pleuritic chest pain on the right side for the past few days.  He went to urgent care today in the send the patient to the hospital due to history of pulmonary embolism. CT angiogram was done which showed multiple pulmonary emboli in both lungs with right heart strain.  Heparin drip was started and patient is admitted for further evaluation and management.  History of recurrent PE.  He recently moved to [place] from [place].  Previous care at [facility].  Medical records reviewed. 11/13/2011 he developed extensive bilateral pulmonary emboli with evidence of right heart strain and possible lingular infarct.  At that time, he also had nonconclusive noncompressible thrombus in the left popliteal vein.  Patient reports that he Coumadin for couple of months and chemo. Per patient, this happened after hip revision surgery. Prior to his PE/DVT episodes in 2013, he also reports right upper extremity clots that developed after right shoulder history.  Reports that he took Coumadin for very short period of time.  Records not available to me at this point.  Patient denies any trauma, recent surgery, long distance car trip or air flight.  Patient reports feeling slightly better today.  On heparin drip.  Wife at bedside.  Continues to have pleuritic chest pain.  Mild shortness of breath. He lost about 10 pounds since January 2019.  At that time he and his wife went through 21-day of "cleaning" by dieting and taking capsules.  Denies any fever, chills, night sweats.  Denies any  bloody stool or black stool, or easy bruising. Family history of lung cancer, brother Review of Systems  Constitutional: Positive for weight loss. Negative for chills, fever and malaise/fatigue.  HENT: Negative for sore throat.   Eyes: Negative for redness.  Respiratory: Positive for shortness of breath. Negative for cough.   Cardiovascular: Positive for chest pain.  Gastrointestinal: Negative for blood in stool, nausea and vomiting.  Genitourinary: Negative for dysuria.  Skin: Negative for rash.  Neurological: Negative for dizziness.  Endo/Heme/Allergies: Does not bruise/bleed easily.  Psychiatric/Behavioral: Negative for hallucinations and substance abuse.    Allergies  Allergen Reactions  . Amoxicillin Nausea Only    Has patient had a PCN reaction causing immediate rash, facial/tongue/throat swelling, SOB or lightheadedness with hypotension: No Has patient had a PCN reaction causing severe rash involving mucus membranes or skin necrosis: No Has patient had a PCN reaction that required hospitalization: No Has patient had a PCN reaction occurring within the last 10 years: Yes If all of the above answers are "NO", then may proceed with Cephalosporin use.    Patient Active Problem List   Diagnosis Date Noted  . Pulmonary embolism (HCC) 04/22/2018     Past Medical History:  Diagnosis Date  . Arthritis   . Hypertension   . Pulmonary embolism [facility])      Past Surgical History:  Procedure Laterality Date  . JOINT REPLACEMENT      Social History   Socioeconomic History  . Marital status: Married    Spouse name: Not on file  . Number of children: Not on file  .  Years of education: Not on file  . Highest education level: Not on file  Occupational History  . Not on file  Social Needs  . Financial resource strain: Not on file  . Food insecurity:    Worry: Not on file    Inability: Not on file  . Transportation needs:    Medical: Not on file    Non-medical: Not on  file  Tobacco Use  . Smoking status: Never Smoker  . Smokeless tobacco: Never Used  Substance and Sexual Activity  . Alcohol use: Yes  . Drug use: Never  . Sexual activity: Not on file  Lifestyle  . Physical activity:    Days per week: Not on file    Minutes per session: Not on file  . Stress: Not on file  Relationships  . Social connections:    Talks on phone: Not on file    Gets together: Not on file    Attends religious service: Not on file    Active member of club or organization: Not on file    Attends meetings of clubs or organizations: Not on file    Relationship status: Not on file  . Intimate partner violence:    Fear of current or ex partner: Not on file    Emotionally abused: Not on file    Physically abused: Not on file    Forced sexual activity: Not on file  Other Topics Concern  . Not on file  Social History Narrative  . Not on file     Family History  Problem Relation Age of Onset  . Lung cancer Brother      Current Facility-Administered Medications:  .  docusate sodium (COLACE) capsule 100 mg, 100 mg, Oral, BID PRN, Altamese Dilling, MD .  heparin ADULT infusion 100 units/mL (25000 units/254mL sodium chloride 0.45%), 1,500 Units/hr, Intravenous, Continuous, Willy Eddy, MD, Last Rate: 15 mL/hr at 04/22/18 1732, 1,500 Units/hr at 04/22/18 1732 .  latanoprost (XALATAN) 0.005 % ophthalmic solution 1 drop, 1 drop, Right Eye, QHS, Altamese Dilling, MD .  morphine 4 MG/ML injection 4 mg, 4 mg, Intravenous, Q4H PRN, Altamese Dilling, MD .  morphine 4 MG/ML injection, , , ,  .  oxyCODONE-acetaminophen (PERCOCET/ROXICET) 5-325 MG per tablet 1 tablet, 1 tablet, Oral, Q4H PRN, Altamese Dilling, MD .  polyvinyl alcohol (LIQUIFILM TEARS) 1.4 % ophthalmic solution 1 drop, 1 drop, Both Eyes, Q4H PRN, Altamese Dilling, MD .  senna-docusate (Senokot-S) tablet 1 tablet, 1 tablet, Oral, BID, Altamese Dilling, MD   Physical  exam: Vitals:   04/22/18 1430 04/22/18 1500 04/22/18 1550 04/22/18 1600  BP: 128/90 117/85 140/84   Pulse: 97  83   Resp: (!) 38 (!) 24 (!) 22   Temp:   98.2 F (36.8 C)   TempSrc:   Oral   SpO2: 94%  94%   Weight:    198 lb 10.2 oz (90.1 kg)  Height:    5\' 11"  (1.803 m)   Physical Exam  Constitutional: He is oriented to person, place, and time. No distress.  HENT:  Head: Normocephalic.  Eyes: Pupils are equal, round, and reactive to light. EOM are normal.  Neck: Normal range of motion. Neck supple.  Cardiovascular: Normal rate and normal heart sounds.  Pulmonary/Chest: Effort normal. No respiratory distress. He exhibits no tenderness.  Abdominal: Soft. Bowel sounds are normal. There is no tenderness.  Musculoskeletal: Normal range of motion. He exhibits no edema.  Neurological: He is alert and oriented to person,  place, and time. No cranial nerve deficit.  Skin: Skin is warm and dry.  Psychiatric: Affect normal.        CMP Latest Ref Rng & Units 04/22/2018  Glucose 70 - 99 mg/dL 161(W)  BUN 8 - 23 mg/dL 15  Creatinine 9.60 - 4.54 mg/dL 0.98  Sodium 119 - 147 mmol/L 138  Potassium 3.5 - 5.1 mmol/L 4.2  Chloride 98 - 111 mmol/L 99  CO2 22 - 32 mmol/L 28  Calcium 8.9 - 10.3 mg/dL 9.6  Total Protein 6.5 - 8.1 g/dL 8.2(N)  Total Bilirubin 0.3 - 1.2 mg/dL 1.1  Alkaline Phos 38 - 126 U/L 73  AST 15 - 41 U/L 18  ALT 0 - 44 U/L 17   CBC Latest Ref Rng & Units 04/22/2018  WBC 4.0 - 10.5 K/uL 6.6  Hemoglobin 13.0 - 17.0 g/dL 56.2  Hematocrit 13.0 - 52.0 % 46.2  Platelets 150 - 400 K/uL 170   RADIOGRAPHIC STUDIES: I have personally reviewed the radiological images as listed and agreed with the findings in the report.  Ct Angio Chest Pe W And/or Wo Contrast  Result Date: 04/22/2018 CLINICAL DATA:  Shortness of breath. Right upper quadrant abdomen pain. Clinical concern for pulmonary embolism. EXAM: CT ANGIOGRAPHY CHEST WITH CONTRAST TECHNIQUE: Multidetector CT imaging of  the chest was performed using the standard protocol during bolus administration of intravenous contrast. Multiplanar CT image reconstructions and MIPs were obtained to evaluate the vascular anatomy. CONTRAST:  75mL ISOVUE-370 IOPAMIDOL (ISOVUE-370) INJECTION 76% COMPARISON:  Portable chest obtained earlier today. FINDINGS: Cardiovascular: Multiple moderate-sized bilateral pulmonary arterial filling defects. The right ventricular to left ventricular ratio is 1.2. Borderline enlarged heart. No pericardial effusion seen. Mediastinum/Nodes: No enlarged mediastinal, hilar, or axillary lymph nodes. Thyroid gland, trachea, and esophagus demonstrate no significant findings. Small hiatal hernia. Lungs/Pleura: Mild bilateral lower lobe dependent atelectasis. No pleural fluid. Upper Abdomen: Bilateral renal cysts. Musculoskeletal: Thoracic spine degenerative changes. Right shoulder prosthesis. Review of the MIP images confirms the above findings. IMPRESSION: 1. Positive for acute PE with CT evidence of right heart strain (RV/LV Ratio = 1.2) consistent with at least submassive (intermediate risk) PE. The presence of right heart strain has been associated with an increased risk of morbidity and mortality. Please activate Code PE by paging [phone]. 2. Small hiatal hernia. Critical Value/emergent results were called by telephone at the time of interpretation on 04/22/2018 at 11:13 am to Dr. Willy Eddy , who verbally acknowledged these results. Electronically Signed   By: Beckie Salts M.D.   On: 04/22/2018 11:14   Dg Chest Portable 1 View  Result Date: 04/22/2018 CLINICAL DATA:  Right side chest pain. EXAM: PORTABLE CHEST 1 VIEW COMPARISON:  None. FINDINGS: Cardiomegaly with vascular congestion. Low lung volumes with bibasilar atelectasis. No visible significant effusions or acute bony abnormality. IMPRESSION: Low lung volumes with bibasilar atelectasis. Mild cardiomegaly and vascular congestion. Electronically  Signed   By: Charlett Nose M.D.   On: 04/22/2018 10:30    Assessment and plan- Patient is a 72 y.o. male with history of pulmonary embolism, lower extremity DVT, hypertension, arthritis presented to emergency for evaluation pruritus chest pain.  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  Unprovoked  recommend long-term anticoagulation.  Discussed with patient. Agree with heparin drip, recommend continue heparin drip for 48 to 72 hours.  At discharge, recommend switch patient to Eliquis. Patient can follow-up outpatient. Agree with obtaining echocardiogram for further evaluation.  #Pleuritic chest pain, due  to pulmonary embolism.  Started on oral Percocet.   Thank you for allowing me to participate in the care of this patient.   Rickard Patience, MD, PhD Hematology Oncology [facility] Pager- [phone] 04/22/2018

## 2018-04-22 NOTE — Progress Notes (Signed)
ANTICOAGULATION CONSULT NOTE - Initial Consult  Pharmacy Consult for Heparin Drip Indication: VTE treatment  Allergies  Allergen Reactions  . Amoxicillin Nausea Only    Has patient had a PCN reaction causing immediate rash, facial/tongue/throat swelling, SOB or lightheadedness with hypotension: No Has patient had a PCN reaction causing severe rash involving mucus membranes or skin necrosis: No Has patient had a PCN reaction that required hospitalization: No Has patient had a PCN reaction occurring within the last 10 years: Yes If all of the above answers are "NO", then may proceed with Cephalosporin use.    Patient Measurements: Height: 5\' 11"  (180.3 cm) Weight: 190 lb (86.2 kg) IBW/kg (Calculated) : 75.3 Heparin Dosing Weight: 86.2 kg  Vital Signs: Temp: 98.2 F (36.8 C) (10/30 0933) Temp Source: Oral (10/30 0933) BP: 117/86 (10/30 1030) Pulse Rate: 53 (10/30 1030)  Labs: Recent Labs    04/22/18 0950  HGB 15.3  HCT 46.2  PLT 170  CREATININE 0.97  TROPONINI <0.03    Estimated Creatinine Clearance: 74.4 mL/min (by C-G formula based on SCr of 0.97 mg/dL).   Medical History: Past Medical History:  Diagnosis Date  . Arthritis   . Hypertension   . Pulmonary embolism (HCC)     Medications:  Scheduled:   Infusions:  . heparin 1,500 Units/hr (04/22/18 1201)    Assessment: Pharmacy consulted to dose and monitor heparin drip for this 72 yo male presenting to ED with c/o upper abdominal pain that started yesterday.    Goal of Therapy:  Heparin level 0.3-0.7 units/ml Monitor platelets by anticoagulation protocol: Yes   Plan:  Give 5000 units bolus x 1 Start heparin infusion at 1500 units/hr Check anti-Xa level in 8 hours and daily while on heparin Continue to monitor H&H and platelets  HL ordered for tonight at 20:00.   Korrine Sicard K, RPH 04/22/2018,12:12 PM

## 2018-04-22 NOTE — Progress Notes (Signed)
ANTICOAGULATION CONSULT NOTE - Initial Consult  Pharmacy Consult for Heparin Drip Indication: VTE treatment  Allergies  Allergen Reactions  . Amoxicillin Nausea Only    Has patient had a PCN reaction causing immediate rash, facial/tongue/throat swelling, SOB or lightheadedness with hypotension: No Has patient had a PCN reaction causing severe rash involving mucus membranes or skin necrosis: No Has patient had a PCN reaction that required hospitalization: No Has patient had a PCN reaction occurring within the last 10 years: Yes If all of the above answers are "NO", then may proceed with Cephalosporin use.    Patient Measurements: Height: 5\' 11"  (180.3 cm) Weight: 198 lb 10.2 oz (90.1 kg) IBW/kg (Calculated) : 75.3 Heparin Dosing Weight: 86.2 kg  Vital Signs: Temp: 98.7 F (37.1 C) (10/30 1946) Temp Source: Oral (10/30 1946) BP: 118/89 (10/30 1946) Pulse Rate: 64 (10/30 1946)  Labs: Recent Labs    04/22/18 0950 04/22/18 1145 04/22/18 1951  HGB 15.3  --   --   HCT 46.2  --   --   PLT 170  --   --   APTT  --  27  --   LABPROT  --  13.8  --   INR  --  1.07  --   HEPARINUNFRC  --   --  1.20*  CREATININE 0.97  --   --   TROPONINI <0.03 <0.03  --     Estimated Creatinine Clearance: 74.4 mL/min (by C-G formula based on SCr of 0.97 mg/dL).   Medical History: Past Medical History:  Diagnosis Date  . Arthritis   . Hypertension   . Pulmonary embolism (HCC)     Medications:  Scheduled:  . latanoprost  1 drop Right Eye QHS  . morphine      . senna-docusate  1 tablet Oral BID   Infusions:    Assessment: Pharmacy consulted to dose and monitor heparin drip for this 72 yo male presenting to ED with c/o upper abdominal pain that started yesterday.    Goal of Therapy:  Heparin level 0.3-0.7 units/ml Monitor platelets by anticoagulation protocol: Yes   Plan:  Give 5000 units bolus x 1 Start heparin infusion at 1500 units/hr Check anti-Xa level in 8 hours and daily  while on heparin Continue to monitor H&H and platelets  HL ordered for tonight at 20:00.   10/30:  HL @ 20:00 = 1.2 Suspect this may be lab error or sample was drawn incorrectly.   Will repeat HL on 10/30 @ 21:45. Will hold Heparin gtt for 1 hr pending lab redraw.  Called RN and told her to stop heparin gtt.  Will decrease dose to 1200 units/hr if 2nd draw validates 1st lab result.  If 2nd HL is therapeutic resume heparin gtt @ 1500 units/hr.   Garet Hooton D, RPH 04/22/2018,9:45 PM

## 2018-04-22 NOTE — ED Notes (Signed)
Sandwich tray given to pt with ginger ale

## 2018-04-23 ENCOUNTER — Inpatient Hospital Stay: Payer: Medicare Other

## 2018-04-23 DIAGNOSIS — I82431 Acute embolism and thrombosis of right popliteal vein: Secondary | ICD-10-CM

## 2018-04-23 LAB — CBC
HEMATOCRIT: 43.7 % (ref 39.0–52.0)
HEMOGLOBIN: 14.4 g/dL (ref 13.0–17.0)
MCH: 30.1 pg (ref 26.0–34.0)
MCHC: 33 g/dL (ref 30.0–36.0)
MCV: 91.2 fL (ref 80.0–100.0)
Platelets: 152 10*3/uL (ref 150–400)
RBC: 4.79 MIL/uL (ref 4.22–5.81)
RDW: 12.4 % (ref 11.5–15.5)
WBC: 6.3 10*3/uL (ref 4.0–10.5)
nRBC: 0 % (ref 0.0–0.2)

## 2018-04-23 LAB — BASIC METABOLIC PANEL
Anion gap: 7 (ref 5–15)
BUN: 9 mg/dL (ref 8–23)
CHLORIDE: 103 mmol/L (ref 98–111)
CO2: 27 mmol/L (ref 22–32)
Calcium: 8.8 mg/dL — ABNORMAL LOW (ref 8.9–10.3)
Creatinine, Ser: 0.77 mg/dL (ref 0.61–1.24)
GFR calc Af Amer: >60 mL/min (ref >60)
GFR calc non Af Amer: >60 mL/min (ref >60)
GLUCOSE: 103 mg/dL — AB (ref 70–99)
POTASSIUM: 3.8 mmol/L (ref 3.5–5.1)
Sodium: 137 mmol/L (ref 135–145)

## 2018-04-23 LAB — ECHOCARDIOGRAM COMPLETE
HEIGHTINCHES: 71 in
WEIGHTICAEL: 3178.15 [oz_av]

## 2018-04-23 LAB — HEPARIN LEVEL (UNFRACTIONATED)
HEPARIN UNFRACTIONATED: 0.69 [IU]/mL (ref 0.30–0.70)
HEPARIN UNFRACTIONATED: 0.38 [IU]/mL (ref 0.30–0.70)

## 2018-04-23 NOTE — Progress Notes (Signed)
[employer] - [place] at [facility]      PATIENT NAME: Tyrone Coffey    MR#:  [id number]  DATE OF BIRTH:  Oct 27, 1945  SUBJECTIVE:   She presented to the hospital due to left-sided chest pain and noted to have CT chest findings suggestive of pulmonary embolism.  Patient still having some pleuritic chest pain but improved since yesterday.  He denies any other associated symptoms.  REVIEW OF SYSTEMS:    Review of Systems  Constitutional: Negative for chills and fever.  HENT: Negative for congestion and tinnitus.   Eyes: Negative for blurred vision and double vision.  Respiratory: Negative for cough, shortness of breath and wheezing.   Cardiovascular: Positive for chest pain (pleuritic). Negative for orthopnea and PND.  Gastrointestinal: Negative for abdominal pain, diarrhea, nausea and vomiting.  Genitourinary: Negative for dysuria and hematuria.  Neurological: Negative for dizziness, sensory change and focal weakness.  All other systems reviewed and are negative.   Nutrition: Heart Healthy Tolerating Diet: Yes Tolerating PT: Ambulatory  DRUG ALLERGIES:   Allergies  Allergen Reactions  . Amoxicillin Nausea Only    Has patient had a PCN reaction causing immediate rash, facial/tongue/throat swelling, SOB or lightheadedness with hypotension: No Has patient had a PCN reaction causing severe rash involving mucus membranes or skin necrosis: No Has patient had a PCN reaction that required hospitalization: No Has patient had a PCN reaction occurring within the last 10 years: Yes If all of the above answers are "NO", then may proceed with Cephalosporin use.    VITALS:  Blood pressure 108/79, pulse 74, temperature 98.5 F (36.9 C), temperature source Oral, resp. rate 16, height 5\' 11"  (1.803 m), weight 90.1 kg, SpO2 95 %.  PHYSICAL EXAMINATION:   Physical Exam  GENERAL:  72 y.o.-year-old patient lying in bed in no acute distress.  EYES: Pupils equal, round,  reactive to light and accommodation. No scleral icterus. Extraocular muscles intact.  HEENT: Head atraumatic, normocephalic. Oropharynx and nasopharynx clear.  NECK:  Supple, no jugular venous distention. No thyroid enlargement, no tenderness.  LUNGS: Normal breath sounds bilaterally, no wheezing, rales, rhonchi. No use of accessory muscles of respiration.  CARDIOVASCULAR: S1, S2 normal. No murmurs, rubs, or gallops.  ABDOMEN: Soft, nontender, nondistended. Bowel sounds present. No organomegaly or mass.  EXTREMITIES: No cyanosis, clubbing or edema b/l.    NEUROLOGIC: Cranial nerves II through XII are intact. No focal Motor or sensory deficits b/l.   PSYCHIATRIC: The patient is alert and oriented x 3.  SKIN: No obvious rash, lesion, or ulcer.    LABORATORY PANEL:   CBC Recent Labs  Lab 04/23/18 0459  WBC 6.3  HGB 14.4  HCT 43.7  PLT 152   ------------------------------------------------------------------------------------------------------------------  Chemistries  Recent Labs  Lab 04/22/18 0950 04/23/18 0459  NA 138 137  K 4.2 3.8  CL 99 103  CO2 28 27  GLUCOSE 105* 103*  BUN 15 9  CREATININE 0.97 0.77  CALCIUM 9.6 8.8*  AST 18  --   ALT 17  --   ALKPHOS 73  --   BILITOT 1.1  --    ------------------------------------------------------------------------------------------------------------------  Cardiac Enzymes Recent Labs  Lab 04/22/18 1145  TROPONINI <0.03   ------------------------------------------------------------------------------------------------------------------  RADIOLOGY:  Ct Angio Chest Pe W And/or Wo Contrast  Result Date: 04/22/2018 CLINICAL DATA:  Shortness of breath. Right upper quadrant abdomen pain. Clinical concern for pulmonary embolism. EXAM: CT ANGIOGRAPHY CHEST WITH CONTRAST TECHNIQUE: Multidetector CT imaging of the chest was performed using the  standard protocol during bolus administration of intravenous contrast. Multiplanar CT  image reconstructions and MIPs were obtained to evaluate the vascular anatomy. CONTRAST:  75mL ISOVUE-370 IOPAMIDOL (ISOVUE-370) INJECTION 76% COMPARISON:  Portable chest obtained earlier today. FINDINGS: Cardiovascular: Multiple moderate-sized bilateral pulmonary arterial filling defects. The right ventricular to left ventricular ratio is 1.2. Borderline enlarged heart. No pericardial effusion seen. Mediastinum/Nodes: No enlarged mediastinal, hilar, or axillary lymph nodes. Thyroid gland, trachea, and esophagus demonstrate no significant findings. Small hiatal hernia. Lungs/Pleura: Mild bilateral lower lobe dependent atelectasis. No pleural fluid. Upper Abdomen: Bilateral renal cysts. Musculoskeletal: Thoracic spine degenerative changes. Right shoulder prosthesis. Review of the MIP images confirms the above findings. IMPRESSION: 1. Positive for acute PE with CT evidence of right heart strain (RV/LV Ratio = 1.2) consistent with at least submassive (intermediate risk) PE. The presence of right heart strain has been associated with an increased risk of morbidity and mortality. Please activate Code PE by paging [phone]. 2. Small hiatal hernia. Critical Value/emergent results were called by telephone at the time of interpretation on 04/22/2018 at 11:13 am to Dr. Willy Eddy , who verbally acknowledged these results. Electronically Signed   By: Beckie Salts M.D.   On: 04/22/2018 11:14   Dg Chest Portable 1 View  Result Date: 04/22/2018 CLINICAL DATA:  Right side chest pain. EXAM: PORTABLE CHEST 1 VIEW COMPARISON:  None. FINDINGS: Cardiomegaly with vascular congestion. Low lung volumes with bibasilar atelectasis. No visible significant effusions or acute bony abnormality. IMPRESSION: Low lung volumes with bibasilar atelectasis. Mild cardiomegaly and vascular congestion. Electronically Signed   By: Charlett Nose M.D.   On: 04/22/2018 10:30     ASSESSMENT AND PLAN:   72 year old male with past medical  history of hypertension, osteoarthritis, previous history of pulmonary embolism who presented to the hospital due to left-sided pleuritic chest pain and noted to have an acute pulmonary embolism.  1.  Acute pulmonary embolism-this was the cause of patient's exertional dyspnea and pleuritic left-sided chest pain.  This is a unprovoked VTE. - Patient previously had a pulmonary embolism was treated for 6 months with Coumadin and then stopped and now comes back with the above symptoms. - Seen by hematology oncology and they recommend lifelong anticoagulation.  Presently patient is on a heparin drip and can likely be switched over to oral Eliquis tomorrow. -We will get Dopplers of lower extremities to rule out further evidence of DVT.  2.  Glaucoma-continue latanoprost eyedrops.  3.  Essential hypertension- patient's blood pressure is on the low side therefore hold antihypertensives for now.   All the records are reviewed and case discussed with Care Management/Social Worker. Management plans discussed with the patient, family and they are in agreement.  CODE STATUS: Full code  DVT Prophylaxis: Heparin gtt  TOTAL TIME TAKING CARE OF THIS PATIENT: 30 minutes.   POSSIBLE D/C IN 1-2 DAYS, DEPENDING ON CLINICAL CONDITION.   Houston Siren M.D on 04/23/2018 at 3:49 PM  Between 7am to 6pm - Pager - [phone]  After 6pm go to [email or web address] - Scientist, research (life sciences) [facility]  Office  [phone]  CC: Primary care physician; Sotolongo, Christiana Pellant, MD

## 2018-04-23 NOTE — Progress Notes (Signed)
Hematology/Oncology Progress Note [facility] Telephone:([phone] Fax:([phone]  Patient Care Team: Tammi Klippel, MD as PCP - General (Family Medicine)   Name of the patient: Tyrone Coffey  [id number]  12-May-1946  Date of visit: 04/23/18   INTERVAL HISTORY-  No acute overnight events. Still has right side pruritic chest pain.  Echocardiogram showed LVEF 60-65%, wall motion normal, left ventricle diastolic function parameter normal. Right ventricle mildly dilated. Wall thickness normal.     Review of systems-  Constitutional: Positive for weight loss. Negative for chills, fever and malaise/fatigue.  HENT: Negative for sore throat.   Eyes: Negative for redness.  Respiratory: Positive for shortness of breath. Negative for cough.   Cardiovascular: Positive for chest pain.  Gastrointestinal: Negative for blood in stool, nausea and vomiting.  Genitourinary: Negative for dysuria.  Skin: Negative for rash.  Neurological: Negative for dizziness.  Endo/Heme/Allergies: Does not bruise/bleed easily.  Psychiatric/Behavioral: Negative for hallucinations and substance abuse  Allergies  Allergen Reactions  . Amoxicillin Nausea Only    Has patient had a PCN reaction causing immediate rash, facial/tongue/throat swelling, SOB or lightheadedness with hypotension: No Has patient had a PCN reaction causing severe rash involving mucus membranes or skin necrosis: No Has patient had a PCN reaction that required hospitalization: No Has patient had a PCN reaction occurring within the last 10 years: Yes If all of the above answers are "NO", then may proceed with Cephalosporin use.    Patient Active Problem List   Diagnosis Date Noted  . Pulmonary embolism (HCC) 04/22/2018  . Chest pain      Past Medical History:  Diagnosis Date  . Arthritis   . Hypertension   . Pulmonary embolism [facility])      Past Surgical History:  Procedure Laterality Date  .  JOINT REPLACEMENT      Social History   Socioeconomic History  . Marital status: Married    Spouse name: Not on file  . Number of children: Not on file  . Years of education: Not on file  . Highest education level: Not on file  Occupational History  . Not on file  Social Needs  . Financial resource strain: Not on file  . Food insecurity:    Worry: Not on file    Inability: Not on file  . Transportation needs:    Medical: Not on file    Non-medical: Not on file  Tobacco Use  . Smoking status: Never Smoker  . Smokeless tobacco: Never Used  Substance and Sexual Activity  . Alcohol use: Yes  . Drug use: Never  . Sexual activity: Not on file  Lifestyle  . Physical activity:    Days per week: Not on file    Minutes per session: Not on file  . Stress: Not on file  Relationships  . Social connections:    Talks on phone: Not on file    Gets together: Not on file    Attends religious service: Not on file    Active member of club or organization: Not on file    Attends meetings of clubs or organizations: Not on file    Relationship status: Not on file  . Intimate partner violence:    Fear of current or ex partner: Not on file    Emotionally abused: Not on file    Physically abused: Not on file    Forced sexual activity: Not on file  Other Topics Concern  . Not on file  Social  History Narrative  . Not on file     Family History  Problem Relation Age of Onset  . Lung cancer Brother      Current Facility-Administered Medications:  .  docusate sodium (COLACE) capsule 100 mg, 100 mg, Oral, BID PRN, Altamese Dilling, MD .  heparin ADULT infusion 100 units/mL (25000 units/288mL sodium chloride 0.45%), 1,200 Units/hr, Intravenous, Continuous, Sherryll Burger, Vipul, MD, Last Rate: 12 mL/hr at 04/23/18 1810, 1,200 Units/hr at 04/23/18 1810 .  latanoprost (XALATAN) 0.005 % ophthalmic solution 1 drop, 1 drop, Right Eye, QHS, Altamese Dilling, MD, 1 drop at 04/22/18 2304 .   morphine 4 MG/ML injection 4 mg, 4 mg, Intravenous, Q4H PRN, Altamese Dilling, MD, 4 mg at 04/23/18 0433 .  oxyCODONE-acetaminophen (PERCOCET/ROXICET) 5-325 MG per tablet 1 tablet, 1 tablet, Oral, Q4H PRN, Altamese Dilling, MD, 1 tablet at 04/23/18 2002 .  polyvinyl alcohol (LIQUIFILM TEARS) 1.4 % ophthalmic solution 1 drop, 1 drop, Both Eyes, Q4H PRN, Altamese Dilling, MD .  senna-docusate (Senokot-S) tablet 1 tablet, 1 tablet, Oral, BID, Altamese Dilling, MD, 1 tablet at 04/23/18 0904   Physical exam:  Vitals:   04/22/18 1946 04/23/18 0427 04/23/18 1348 04/23/18 1957  BP: 118/89 111/86 108/79 (!) 151/94  Pulse: 64 73 74 78  Resp: 20 16  18   Temp: 98.7 F (37.1 C) 98.4 F (36.9 C) 98.5 F (36.9 C) 99 F (37.2 C)  TempSrc: Oral Oral Oral Oral  SpO2: 95% 98% 95% 99%  Weight:      Height:       Physical Exam  Constitutional: He is oriented to person, place, and time. No distress.  HENT:  Head: Normocephalic.  Eyes: Pupils are equal, round, and reactive to light. EOM are normal.  Neck: Normal range of motion. Neck supple.  Cardiovascular: Normal rate and normal heart sounds.  Pulmonary/Chest: Effort normal. No respiratory distress. He exhibits no tenderness.  Abdominal: Soft. Bowel sounds are normal. There is no tenderness.  Musculoskeletal: Normal range of motion. He exhibits no edema.  Neurological: He is alert and oriented to person, place, and time. No cranial nerve deficit.  Skin: Skin is warm and dry.  Psychiatric: Affect normal.    CMP Latest Ref Rng & Units 04/23/2018  Glucose 70 - 99 mg/dL 161(W)  BUN 8 - 23 mg/dL 9  Creatinine 9.60 - 4.54 mg/dL 0.98  Sodium 119 - 147 mmol/L 137  Potassium 3.5 - 5.1 mmol/L 3.8  Chloride 98 - 111 mmol/L 103  CO2 22 - 32 mmol/L 27  Calcium 8.9 - 10.3 mg/dL 8.2(N)  Total Protein 6.5 - 8.1 g/dL -  Total Bilirubin 0.3 - 1.2 mg/dL -  Alkaline Phos 38 - 562 U/L -  AST 15 - 41 U/L -  ALT 0 - 44 U/L -   CBC  Latest Ref Rng & Units 04/23/2018  WBC 4.0 - 10.5 K/uL 6.3  Hemoglobin 13.0 - 17.0 g/dL 13.0  Hematocrit 86.5 - 52.0 % 43.7  Platelets 150 - 400 K/uL 152   RADIOGRAPHIC STUDIES: I have personally reviewed the radiological images as listed and agreed with the findings in the report. Ct Angio Chest Pe W And/or Wo Contrast  Result Date: 04/22/2018 CLINICAL DATA:  Shortness of breath. Right upper quadrant abdomen pain. Clinical concern for pulmonary embolism. EXAM: CT ANGIOGRAPHY CHEST WITH CONTRAST TECHNIQUE: Multidetector CT imaging of the chest was performed using the standard protocol during bolus administration of intravenous contrast. Multiplanar CT image reconstructions and MIPs were obtained to evaluate  the vascular anatomy. CONTRAST:  75mL ISOVUE-370 IOPAMIDOL (ISOVUE-370) INJECTION 76% COMPARISON:  Portable chest obtained earlier today. FINDINGS: Cardiovascular: Multiple moderate-sized bilateral pulmonary arterial filling defects. The right ventricular to left ventricular ratio is 1.2. Borderline enlarged heart. No pericardial effusion seen. Mediastinum/Nodes: No enlarged mediastinal, hilar, or axillary lymph nodes. Thyroid gland, trachea, and esophagus demonstrate no significant findings. Small hiatal hernia. Lungs/Pleura: Mild bilateral lower lobe dependent atelectasis. No pleural fluid. Upper Abdomen: Bilateral renal cysts. Musculoskeletal: Thoracic spine degenerative changes. Right shoulder prosthesis. Review of the MIP images confirms the above findings. IMPRESSION: 1. Positive for acute PE with CT evidence of right heart strain (RV/LV Ratio = 1.2) consistent with at least submassive (intermediate risk) PE. The presence of right heart strain has been associated with an increased risk of morbidity and mortality. Please activate Code PE by paging [phone]. 2. Small hiatal hernia. Critical Value/emergent results were called by telephone at the time of interpretation on 04/22/2018 at 11:13 am  to Dr. Willy Eddy , who verbally acknowledged these results. Electronically Signed   By: Beckie Salts M.D.   On: 04/22/2018 11:14   US Venous Img Lower Bilateral  Result Date: 04/23/2018 CLINICAL DATA:  Pulmonary emboli on recent CT.  Shortness of breath. EXAM: BILATERAL LOWER EXTREMITY VENOUS DOPPLER ULTRASOUND TECHNIQUE: Gray-scale sonography with compression, as well as color and duplex ultrasound, were performed to evaluate the deep venous system from the level of the common femoral vein through the popliteal and proximal calf veins. COMPARISON:  None FINDINGS: On the left, normal compressibility of the common femoral, superficial femoral, and popliteal veins, as well as the proximal calf veins. No filling defects to suggest DVT on grayscale or color Doppler imaging. Doppler waveforms show normal direction of venous flow, normal respiratory phasicity and response to augmentation. On the right, common femoral, deep femoral, and femoral veins show normal compressibility and color flow signal. The distal popliteal vein is incompletely compressible with some convincing intraluminal echogenic thrombus. Color flow signal is seen through this segment. Visualized calf veins appear compressible. IMPRESSION: 1. POSITIVE for incompletely occlusive DVT in the distal right popliteal vein. 2. Negative for left lower extremity DVT. Electronically Signed   By: Corlis Leak M.D.   On: 04/23/2018 15:53   Dg Chest Portable 1 View  Result Date: 04/22/2018 CLINICAL DATA:  Right side chest pain. EXAM: PORTABLE CHEST 1 VIEW COMPARISON:  None. FINDINGS: Cardiomegaly with vascular congestion. Low lung volumes with bibasilar atelectasis. No visible significant effusions or acute bony abnormality. IMPRESSION: Low lung volumes with bibasilar atelectasis. Mild cardiomegaly and vascular congestion. Electronically Signed   By: Charlett Nose M.D.   On: 04/22/2018 10:30    Assessment and plan-  Patient is a 72 y.o. male with  history of pulmonary embolism, lower extremity DVT, hypertension, arthritis presented to emergency for evaluation pruritus chest pain.  #unprovoked recurrent pulmonary embolism, and lower extremity DVT  recommend long term anticoagulation.  Continue heparin gtt. If stable enough to be discharged, can be switched to Eliquis.  Patient can follow up with me outpatient for further management.   Thank you for allowing me to participate in the care of this patient.   Rickard Patience, MD, PhD Hematology Oncology [facility] at [facility] Pager- [phone] 04/23/2018

## 2018-04-23 NOTE — Progress Notes (Signed)
ANTICOAGULATION CONSULT NOTE - Follow up Consult  Pharmacy Consult for Heparin Drip Indication: VTE treatment  Allergies  Allergen Reactions  . Amoxicillin Nausea Only    Has patient had a PCN reaction causing immediate rash, facial/tongue/throat swelling, SOB or lightheadedness with hypotension: No Has patient had a PCN reaction causing severe rash involving mucus membranes or skin necrosis: No Has patient had a PCN reaction that required hospitalization: No Has patient had a PCN reaction occurring within the last 10 years: Yes If all of the above answers are "NO", then may proceed with Cephalosporin use.    Patient Measurements: Height: 5\' 11"  (180.3 cm) Weight: 198 lb 10.2 oz (90.1 kg) IBW/kg (Calculated) : 75.3 Heparin Dosing Weight: 86.2 kg  Vital Signs: Temp: 98.4 F (36.9 C) (10/31 0427) Temp Source: Oral (10/31 0427) BP: 111/86 (10/31 0427) Pulse Rate: 73 (10/31 0427)  Labs: Recent Labs    04/22/18 0950 04/22/18 1145 04/22/18 1951 04/22/18 2148 04/23/18 0459 04/23/18 0822  HGB 15.3  --   --   --  14.4  --   HCT 46.2  --   --   --  43.7  --   PLT 170  --   --   --  152  --   APTT  --  27  --   --   --   --   LABPROT  --  13.8  --   --   --   --   INR  --  1.07  --   --   --   --   HEPARINUNFRC  --   --  1.20* 1.13*  --  0.69  CREATININE 0.97  --   --   --  0.77  --   TROPONINI <0.03 <0.03  --   --   --   --     Estimated Creatinine Clearance: 90.2 mL/min (by C-G formula based on SCr of 0.77 mg/dL).   Medical History: Past Medical History:  Diagnosis Date  . Arthritis   . Hypertension   . Pulmonary embolism (HCC)     Medications:  Scheduled:  . latanoprost  1 drop Right Eye QHS  . senna-docusate  1 tablet Oral BID   Infusions:  . heparin 1,200 Units/hr (04/23/18 0433)    Assessment: Pharmacy consulted to dose and monitor heparin drip for this 72 yo male presenting to ED with c/o upper abdominal pain that started yesterday.    Goal of  Therapy:  Heparin level 0.3-0.7 units/ml Monitor platelets by anticoagulation protocol: Yes   Plan:  10/30: HL @ 21:48 = 1.13 Will restart heparin gtt @ 1200 units/hr on 10/31 @ 00:00. Will draw next HL on 10/31 @ 0800.   10/31 0822 Heparin level 0.69 Will maintain current rate and redraw Heparin level in 8 hours.  Check anti-Xa level in 8 hours and daily while on heparin Continue to monitor H&H and platelets  Orinda Kenner, PharmD 04/23/2018,10:02 AM

## 2018-04-23 NOTE — Progress Notes (Signed)
Notified MD about doppler ultrasound results. Will continue to assess and monitor pt.

## 2018-04-23 NOTE — Progress Notes (Signed)
ANTICOAGULATION CONSULT NOTE - Follow up Consult  Pharmacy Consult for Heparin Drip Indication: VTE treatment  Allergies  Allergen Reactions  . Amoxicillin Nausea Only    Has patient had a PCN reaction causing immediate rash, facial/tongue/throat swelling, SOB or lightheadedness with hypotension: No Has patient had a PCN reaction causing severe rash involving mucus membranes or skin necrosis: No Has patient had a PCN reaction that required hospitalization: No Has patient had a PCN reaction occurring within the last 10 years: Yes If all of the above answers are "NO", then may proceed with Cephalosporin use.    Patient Measurements: Height: 5\' 11"  (180.3 cm) Weight: 198 lb 10.2 oz (90.1 kg) IBW/kg (Calculated) : 75.3 Heparin Dosing Weight: 86.2 kg  Vital Signs: Temp: 99 F (37.2 C) (10/31 1957) Temp Source: Oral (10/31 1957) BP: 151/94 (10/31 1957) Pulse Rate: 78 (10/31 1957)  Labs: Recent Labs    04/22/18 0950 04/22/18 1145  04/22/18 2148 04/23/18 0459 04/23/18 0822 04/23/18 1552  HGB 15.3  --   --   --  14.4  --   --   HCT 46.2  --   --   --  43.7  --   --   PLT 170  --   --   --  152  --   --   APTT  --  27  --   --   --   --   --   LABPROT  --  13.8  --   --   --   --   --   INR  --  1.07  --   --   --   --   --   HEPARINUNFRC  --   --    < > 1.13*  --  0.69 0.38  CREATININE 0.97  --   --   --  0.77  --   --   TROPONINI <0.03 <0.03  --   --   --   --   --    < > = values in this interval not displayed.    Estimated Creatinine Clearance: 90.2 mL/min (by C-G formula based on SCr of 0.77 mg/dL).   Medical History: Past Medical History:  Diagnosis Date  . Arthritis   . Hypertension   . Pulmonary embolism (HCC)     Medications:  Scheduled:  . latanoprost  1 drop Right Eye QHS  . senna-docusate  1 tablet Oral BID   Infusions:  . heparin 1,200 Units/hr (04/23/18 1810)    Assessment: Pharmacy consulted to dose and monitor heparin drip for this 72 yo  male presenting to ED with c/o upper abdominal pain that started yesterday.    Goal of Therapy:  Heparin level 0.3-0.7 units/ml Monitor platelets by anticoagulation protocol: Yes   Plan:  10/30: HL @ 21:48 = 1.13 Will restart heparin gtt @ 1200 units/hr on 10/31 @ 00:00. Will draw next HL on 10/31 @ 0800.   10/31 0822 Heparin level 0.69 Will maintain current rate and redraw Heparin level in 8 hours.  Check anti-Xa level in 8 hours and daily while on heparin Continue to monitor H&H and platelets  10/31 1600 heparin level 0.38. Continue current regimen. Recheck heparin level and CBC with tomorrow AM labs.  Humna Moorehouse S, PharmD 04/23/2018,9:50 PM

## 2018-04-24 DIAGNOSIS — R071 Chest pain on breathing: Secondary | ICD-10-CM

## 2018-04-24 LAB — CBC
HEMATOCRIT: 40.7 % (ref 39.0–52.0)
HEMOGLOBIN: 13.5 g/dL (ref 13.0–17.0)
MCH: 30.2 pg (ref 26.0–34.0)
MCHC: 33.2 g/dL (ref 30.0–36.0)
MCV: 91.1 fL (ref 80.0–100.0)
Platelets: 167 10*3/uL (ref 150–400)
RBC: 4.47 MIL/uL (ref 4.22–5.81)
RDW: 12.2 % (ref 11.5–15.5)
WBC: 5.3 10*3/uL (ref 4.0–10.5)
nRBC: 0 % (ref 0.0–0.2)

## 2018-04-24 LAB — HEPARIN LEVEL (UNFRACTIONATED): HEPARIN UNFRACTIONATED: 0.59 [IU]/mL (ref 0.30–0.70)

## 2018-04-24 MED ORDER — APIXABAN 5 MG PO TABS: 5.0000 mg | ORAL_TABLET | Freq: Two times a day (BID) | ORAL | Status: DC

## 2018-04-24 MED ORDER — SENNOSIDES-DOCUSATE SODIUM 8.6-50 MG PO TABS: 1.0000 | ORAL_TABLET | Freq: Two times a day (BID) | ORAL | 0 refills | Status: DC

## 2018-04-24 MED ORDER — OXYCODONE-ACETAMINOPHEN 5-325 MG PO TABS: 1.0000 | ORAL_TABLET | ORAL | 0 refills | Status: DC | PRN

## 2018-04-24 MED ORDER — APIXABAN 5 MG PO TABS
10.0000 mg | ORAL_TABLET | Freq: Two times a day (BID) | ORAL | Status: DC
  Administered 2018-04-24: 10 mg via ORAL
  Filled 2018-04-24: qty 2

## 2018-04-24 MED ORDER — ELIQUIS 5 MG VTE STARTER PACK: ORAL_TABLET | ORAL | 0 refills | Status: DC

## 2018-04-24 NOTE — Consult Note (Signed)
ANTICOAGULATION CONSULT NOTE - Initial Consult  Pharmacy Consult for apixaban Indication: pulmonary embolus  Allergies  Allergen Reactions  . Amoxicillin Nausea Only    Has patient had a PCN reaction causing immediate rash, facial/tongue/throat swelling, SOB or lightheadedness with hypotension: No Has patient had a PCN reaction causing severe rash involving mucus membranes or skin necrosis: No Has patient had a PCN reaction that required hospitalization: No Has patient had a PCN reaction occurring within the last 10 years: Yes If all of the above answers are "NO", then may proceed with Cephalosporin use.    Patient Measurements: Height: 5\' 11"  (180.3 cm) Weight: 198 lb 10.2 oz (90.1 kg) IBW/kg (Calculated) : 75.3 Heparin Dosing Weight:   Vital Signs: Temp: 98.6 F (37 C) (11/01 0435) Temp Source: Oral (11/01 0435) BP: 129/95 (11/01 0435) Pulse Rate: 51 (11/01 0435)  Labs: Recent Labs    04/22/18 0950 04/22/18 1145  04/23/18 0459 04/23/18 0822 04/23/18 1552 04/24/18 0325  HGB 15.3  --   --  14.4  --   --  13.5  HCT 46.2  --   --  43.7  --   --  40.7  PLT 170  --   --  152  --   --  167  APTT  --  27  --   --   --   --   --   LABPROT  --  13.8  --   --   --   --   --   INR  --  1.07  --   --   --   --   --   HEPARINUNFRC  --   --    < >  --  0.69 0.38 0.59  CREATININE 0.97  --   --  0.77  --   --   --   TROPONINI <0.03 <0.03  --   --   --   --   --    < > = values in this interval not displayed.    Estimated Creatinine Clearance: 90.2 mL/min (by C-G formula based on SCr of 0.77 mg/dL).   Medical History: Past Medical History:  Diagnosis Date  . Arthritis   . Hypertension   . Pulmonary embolism (HCC)     Medications:  Previously on Heparin infusion  Assessment: Pharmacy consulted to dose and monitor Apixaban for this 72 yo male presenting to ED with PE who was previously placed on a heparin infusion.  Goal of Therapy:  Monitor platelets by  anticoagulation protocol: Yes   Plan:  Apixaban 10 mg BID for 7 days followed by Apixaban 5 mg BID thereafter. Will monitor CBC and Scr per protocol.  Orinda Kenner, PharmD Clinical Pharmacist 04/24/2018,11:15 AM

## 2018-04-24 NOTE — Care Management (Signed)
30 day trail coupon provided.  RX called in to [facility] in [place], as they did not have starter pack available.  Confirmed with MD.  Rushie Chestnut unable to give me an out of pocket cost, due to medication requiring a prior auth. Prior authorization # [id number].  PA # [id number].  Per [facility] rep urgent PA can take up to 24 hours, however they should have a response today.  Rep requested that RNCM provide patient with PA # and phone # so patient could call this evening and confirm that it is approved.  RN provided patient and wife with this information.

## 2018-04-24 NOTE — Care Management Important Message (Signed)
Copy of signed IM left with patient in room.  

## 2018-04-24 NOTE — Progress Notes (Signed)
Hematology/Oncology Progress Note [facility] [phone] [phone]  Patient Care Team: Eusebio Me, MD as PCP - General (Family Medicine)   Name of the patient: Tyrone Coffey  [id number]  [birth year]  Date of visit: 04/24/18   INTERVAL HISTORY-  No acute overnight events.  Patient is feeling better today.  Still has right side pleuritic chest pain.  Review of systems-  Constitutional: Positive for weight loss. Negative for chills, fever and malaise/fatigue.  HENT: Negative for sore throat.   Eyes: Negative for redness.  Respiratory: Positive for shortness of breath.    Cardiovascular: Positive for chest pain.  Gastrointestinal: Negative for blood in stool, nausea and vomiting.  Genitourinary: Negative for dysuria.  Skin: Negative for rash.  Neurological: Negative for dizziness.  Endo/Heme/Allergies: Does not bruise/bleed easily.  Psychiatric/Behavioral: Negative for hallucinations and substance abuse  Allergies  Allergen Reactions  . Amoxicillin Nausea Only    Has patient had a PCN reaction causing immediate rash, facial/tongue/throat swelling, SOB or lightheadedness with hypotension: No Has patient had a PCN reaction causing severe rash involving mucus membranes or skin necrosis: No Has patient had a PCN reaction that required hospitalization: No Has patient had a PCN reaction occurring within the last 10 years: Yes If all of the above answers are "NO", then may proceed with Cephalosporin use.    Patient Active Problem List   Diagnosis Date Noted  . Acute deep vein thrombosis (DVT) of popliteal vein of right lower extremity (HCC)   . Pulmonary embolism ([place]) 04/22/2018  . Chest pain      Past Medical History:  Diagnosis Date  . Arthritis   . Hypertension   . Pulmonary embolism [facility])      Past Surgical History:  Procedure Laterality Date  . JOINT REPLACEMENT      Social History   Socioeconomic History    . Marital status: Married    Spouse name: Not on file  . Number of children: Not on file  . Years of education: Not on file  . Highest education level: Not on file  Occupational History  . Not on file  Social Needs  . Financial resource strain: Not on file  . Food insecurity:    Worry: Not on file    Inability: Not on file  . Transportation needs:    Medical: Not on file    Non-medical: Not on file  Tobacco Use  . Smoking status: Never Smoker  . Smokeless tobacco: Never Used  Substance and Sexual Activity  . Alcohol use: Yes  . Drug use: Never  . Sexual activity: Not on file  Lifestyle  . Physical activity:    Days per week: Not on file    Minutes per session: Not on file  . Stress: Not on file  Relationships  . Social connections:    Talks on phone: Not on file    Gets together: Not on file    Attends religious service: Not on file    Active member of club or organization: Not on file    Attends meetings of clubs or organizations: Not on file    Relationship status: Not on file  . Intimate partner violence:    Fear of current or ex partner: Not on file    Emotionally abused: Not on file    Physically abused: Not on file    Forced sexual activity: Not on file  Other Topics Concern  . Not on file  Social History Narrative  .  Not on file     Family History  Problem Relation Age of Onset  . Lung cancer Brother      Current Facility-Administered Medications:  .  apixaban (ELIQUIS) tablet 10 mg, 10 mg, Oral, BID, 10 mg at 04/24/18 1130 **FOLLOWED BY** [START ON 05/01/2018] apixaban (ELIQUIS) tablet 5 mg, 5 mg, Oral, BID, Shari Prows, RPH .  docusate sodium (COLACE) capsule 100 mg, 100 mg, Oral, BID PRN, Vaughan Basta, MD .  latanoprost (XALATAN) 0.005 % ophthalmic solution 1 drop, 1 drop, Right Eye, QHS, Vaughan Basta, MD, 1 drop at 04/23/18 2211 .  morphine 4 MG/ML injection 4 mg, 4 mg, Intravenous, Q4H PRN, Vaughan Basta, MD,  4 mg at 04/23/18 0433 .  oxyCODONE-acetaminophen (PERCOCET/ROXICET) 5-325 MG per tablet 1 tablet, 1 tablet, Oral, Q4H PRN, Vaughan Basta, MD, 1 tablet at 04/24/18 1128 .  polyvinyl alcohol (LIQUIFILM TEARS) 1.4 % ophthalmic solution 1 drop, 1 drop, Both Eyes, Q4H PRN, Vaughan Basta, MD .  senna-docusate (Senokot-S) tablet 1 tablet, 1 tablet, Oral, BID, Vaughan Basta, MD, 1 tablet at 04/24/18 1128   Physical exam:  Vitals:   04/23/18 1348 04/23/18 1957 04/24/18 0435 04/24/18 1142  BP: 108/79 (!) 151/94 (!) 129/95 (!) 136/116  Pulse: 74 78 (!) 51 (!) 51  Resp:  _0 Temp: 98.5 F (36.9 C) 99 F (37.2 C) 98.6 F (37 C) 97.6 F (36.4 C)  TempSrc: Oral Oral Oral Oral  SpO2: 95% 99% 98% 99%  Weight:      Height:       Physical Exam  Constitutional: He is oriented to person, place, and time. No distress.  HENT:  Head: Normocephalic and atraumatic.  Eyes: Pupils are equal, round, and reactive to light. EOM are normal.  Neck: Neck supple.  Cardiovascular: Normal rate.  Pulmonary/Chest: Effort normal. No respiratory distress. He has no wheezes. He has no rales.  Abdominal: Soft. Bowel sounds are normal.  Musculoskeletal: Normal range of motion.  Neurological: He is alert and oriented to person, place, and time.  Skin: Skin is warm and dry.  Psychiatric: Affect normal.      CMP Latest Ref Rng & Units 04/23/2018  Glucose 70 - 99 mg/dL 103(H)  BUN 8 - 23 mg/dL 9  Creatinine 0.61 - 1.24 mg/dL 0.77  Sodium 135 - 145 mmol/L 137  Potassium 3.5 - 5.1 mmol/L 3.8  Chloride 98 - 111 mmol/L 103  CO2 22 - 32 mmol/L 27  Calcium 8.9 - 10.3 mg/dL 8.8(L)  Total Protein 6.5 - 8.1 g/dL -  Total Bilirubin 0.3 - 1.2 mg/dL -  Alkaline Phos 38 - 126 U/L -  AST 15 - 41 U/L -  ALT 0 - 44 U/L -   CBC Latest Ref Rng & Units 04/24/2018  WBC 4.0 - 10.5 K/uL 5.3  Hemoglobin 13.0 - 17.0 g/dL 13.5  Hematocrit 39.0 - 52.0 % 40.7  Platelets 150 - 400 K/uL 167    RADIOGRAPHIC STUDIES: I have personally reviewed the radiological images as listed and agreed with the findings in the report. Ct Angio Chest Pe W And/or Wo Contrast  Result Date: 04/22/2018 CLINICAL DATA:  Shortness of breath. Right upper quadrant abdomen pain. Clinical concern for pulmonary embolism. EXAM: CT ANGIOGRAPHY CHEST WITH CONTRAST TECHNIQUE: Multidetector CT imaging of the chest was performed using the standard protocol during bolus administration of intravenous contrast. Multiplanar CT image reconstructions and MIPs were obtained to evaluate the vascular anatomy. CONTRAST:  32m ISOVUE-370 IOPAMIDOL (ISOVUE-370)  INJECTION 76% COMPARISON:  Portable chest obtained earlier today. FINDINGS: Cardiovascular: Multiple moderate-sized bilateral pulmonary arterial filling defects. The right ventricular to left ventricular ratio is 1.2. Borderline enlarged heart. No pericardial effusion seen. Mediastinum/Nodes: No enlarged mediastinal, hilar, or axillary lymph nodes. Thyroid gland, trachea, and esophagus demonstrate no significant findings. Small hiatal hernia. Lungs/Pleura: Mild bilateral lower lobe dependent atelectasis. No pleural fluid. Upper Abdomen: Bilateral renal cysts. Musculoskeletal: Thoracic spine degenerative changes. Right shoulder prosthesis. Review of the MIP images confirms the above findings. IMPRESSION: 1. Positive for acute PE with CT evidence of right heart strain (RV/LV Ratio = 1.2) consistent with at least submassive (intermediate risk) PE. The presence of right heart strain has been associated with an increased risk of morbidity and mortality. Please activate Code PE by paging [phone]. 2. Small hiatal hernia. Critical Value/emergent results were called by telephone at the time of interpretation on 04/22/2018 at 11:13 am to Dr. Merlyn Lot , who verbally acknowledged these results. Electronically Signed   By: Claudie Revering M.D.   On: 04/22/2018 11:14   US Venous Img Lower  Bilateral  Result Date: 04/23/2018 CLINICAL DATA:  Pulmonary emboli on recent CT.  Shortness of breath. EXAM: BILATERAL LOWER EXTREMITY VENOUS DOPPLER ULTRASOUND TECHNIQUE: Gray-scale sonography with compression, as well as color and duplex ultrasound, were performed to evaluate the deep venous system from the level of the common femoral vein through the popliteal and proximal calf veins. COMPARISON:  None FINDINGS: On the left, normal compressibility of the common femoral, superficial femoral, and popliteal veins, as well as the proximal calf veins. No filling defects to suggest DVT on grayscale or color Doppler imaging. Doppler waveforms show normal direction of venous flow, normal respiratory phasicity and response to augmentation. On the right, common femoral, deep femoral, and femoral veins show normal compressibility and color flow signal. The distal popliteal vein is incompletely compressible with some convincing intraluminal echogenic thrombus. Color flow signal is seen through this segment. Visualized calf veins appear compressible. IMPRESSION: 1. POSITIVE for incompletely occlusive DVT in the distal right popliteal vein. 2. Negative for left lower extremity DVT. Electronically Signed   By: Lucrezia Europe M.D.   On: 04/23/2018 15:53   Dg Chest Portable 1 View  Result Date: 04/22/2018 CLINICAL DATA:  Right side chest pain. EXAM: PORTABLE CHEST 1 VIEW COMPARISON:  None. FINDINGS: Cardiomegaly with vascular congestion. Low lung volumes with bibasilar atelectasis. No visible significant effusions or acute bony abnormality. IMPRESSION: Low lung volumes with bibasilar atelectasis. Mild cardiomegaly and vascular congestion. Electronically Signed   By: Rolm Baptise M.D.   On: 04/22/2018 10:30    Assessment and plan-  Patient is a 72 y.o. male with history of pulmonary embolism, lower extremity DVT, hypertension, arthritis presented to emergency for evaluation pruritus chest pain.  #unprovoked recurrent  pulmonary embolism, and lower extremity DVT   As tolerated heparin drip for anticoagulation.  As just being switched to Eliquis Patient will be given starting kit of Eliquis, 10 mg twice daily for 7 days followed by Eliquis 5 mg twice daily We have discussed that the duration of anticoagulant she will be long-term giving his high risk of recurrence. Cancer center will call patient next week to arrange a follow-up in the next 10 to 14 days. Patient has contact information of my office and knows to call if he has concerns or questions.  Thank you for allowing me to participate in the care of this patient.   Earlie Server, MD, PhD Hematology Oncology  [facility]- [phone] 04/24/2018

## 2018-04-24 NOTE — Progress Notes (Signed)
ANTICOAGULATION CONSULT NOTE - Follow up Consult  Pharmacy Consult for Heparin Drip Indication: VTE treatment  Allergies  Allergen Reactions  . Amoxicillin Nausea Only    Has patient had a PCN reaction causing immediate rash, facial/tongue/throat swelling, SOB or lightheadedness with hypotension: No Has patient had a PCN reaction causing severe rash involving mucus membranes or skin necrosis: No Has patient had a PCN reaction that required hospitalization: No Has patient had a PCN reaction occurring within the last 10 years: Yes If all of the above answers are "NO", then may proceed with Cephalosporin use.    Patient Measurements: Height: 5\' 11"  (180.3 cm) Weight: 198 lb 10.2 oz (90.1 kg) IBW/kg (Calculated) : 75.3 Heparin Dosing Weight: 86.2 kg  Vital Signs: Temp: 98.6 F (37 C) (11/01 0435) Temp Source: Oral (11/01 0435) BP: 129/95 (11/01 0435) Pulse Rate: 51 (11/01 0435)  Labs: Recent Labs    04/22/18 0950 04/22/18 1145  04/23/18 0459 04/23/18 0822 04/23/18 1552 04/24/18 0325  HGB 15.3  --   --  14.4  --   --  13.5  HCT 46.2  --   --  43.7  --   --  40.7  PLT 170  --   --  152  --   --  167  APTT  --  27  --   --   --   --   --   LABPROT  --  13.8  --   --   --   --   --   INR  --  1.07  --   --   --   --   --   HEPARINUNFRC  --   --    < >  --  0.69 0.38 0.59  CREATININE 0.97  --   --  0.77  --   --   --   TROPONINI <0.03 <0.03  --   --   --   --   --    < > = values in this interval not displayed.    Estimated Creatinine Clearance: 90.2 mL/min (by C-G formula based on SCr of 0.77 mg/dL).   Medical History: Past Medical History:  Diagnosis Date  . Arthritis   . Hypertension   . Pulmonary embolism (HCC)     Medications:  Scheduled:  . latanoprost  1 drop Right Eye QHS  . senna-docusate  1 tablet Oral BID   Infusions:  . heparin 1,200 Units/hr (04/24/18 0540)    Assessment: Pharmacy consulted to dose and monitor heparin drip for this 72 yo male  presenting to ED with c/o upper abdominal pain that started yesterday.    Goal of Therapy:  Heparin level 0.3-0.7 units/ml Monitor platelets by anticoagulation protocol: Yes   Plan:  10/30: HL @ 21:48 = 1.13 Will restart heparin gtt @ 1200 units/hr on 10/31 @ 00:00. Will draw next HL on 10/31 @ 0800.   10/31 0822 Heparin level 0.69 Will maintain current rate and redraw Heparin level in 8 hours.  Check anti-Xa level in 8 hours and daily while on heparin Continue to monitor H&H and platelets  10/31 1600 heparin level 0.38. Continue current regimen. Recheck heparin level and CBC with tomorrow AM labs.  11/01 AM heparin level 0.59. Continue current regimen. Recheck heparin level and CBC with tomorrow AM labs.  Mckenzy Salazar S, PharmD 04/24/2018,6:42 AM

## 2018-04-24 NOTE — Discharge Summary (Signed)
[facility] - [place] at [facility]   PATIENT NAME: Tyrone Coffey    MR#:  [id number]  DATE OF BIRTH:  [birth year]  DATE OF ADMISSION:  04/22/2018 ADMITTING PHYSICIAN: Altamese Dilling, MD  DATE OF DISCHARGE: 04/24/2018   PRIMARY CARE PHYSICIAN: Tammi Klippel, MD    ADMISSION DIAGNOSIS:  Other acute pulmonary embolism with acute cor pulmonale (HCC) [I26.09] Chest pain, unspecified type [R07.9]  DISCHARGE DIAGNOSIS:  Principal Problem:   Pulmonary embolism (HCC) Active Problems:   Chest pain   Acute deep vein thrombosis (DVT) of popliteal vein of right lower extremity (HCC)   SECONDARY DIAGNOSIS:   Past Medical History:  Diagnosis Date  . Arthritis   . Hypertension   . Pulmonary embolism [facility])     HOSPITAL COURSE:   72 year old male with past medical history of hypertension, osteoarthritis, previous history of pulmonary embolism who presented to the hospital due to left-sided pleuritic chest pain and noted to have an acute pulmonary embolism.  1.  Acute pulmonary embolism-this was the cause of patient's exertional dyspnea and pleuritic left-sided chest pain.  This is a unprovoked VTE. - Patient previously had a pulmonary embolism was treated for 6 months with Coumadin and then stopped and now comes back with the above symptoms. - Seen by hematology oncology and they recommend lifelong anticoagulation.  Presently patient is on a heparin drip and switched to Eliquis. -Doppler showed LL DVT also.  2.  Glaucoma-continue latanoprost eyedrops.  3.  Essential hypertension- patient's blood pressure is on the low side therefore hold antihypertensives for now.  made changes in home Htn meds on discharge.   DISCHARGE CONDITIONS:   Stable.  CONSULTS OBTAINED:  Treatment Team:  Rickard Patience, MD  DRUG ALLERGIES:   Allergies  Allergen Reactions  . Amoxicillin Nausea Only    Has patient had a PCN reaction causing immediate rash,  facial/tongue/throat swelling, SOB or lightheadedness with hypotension: No Has patient had a PCN reaction causing severe rash involving mucus membranes or skin necrosis: No Has patient had a PCN reaction that required hospitalization: No Has patient had a PCN reaction occurring within the last 10 years: Yes If all of the above answers are "NO", then may proceed with Cephalosporin use.    DISCHARGE MEDICATIONS:   Allergies as of 04/24/2018      Reactions   Amoxicillin Nausea Only   Has patient had a PCN reaction causing immediate rash, facial/tongue/throat swelling, SOB or lightheadedness with hypotension: No Has patient had a PCN reaction causing severe rash involving mucus membranes or skin necrosis: No Has patient had a PCN reaction that required hospitalization: No Has patient had a PCN reaction occurring within the last 10 years: Yes If all of the above answers are "NO", then may proceed with Cephalosporin use.      Medication List    STOP taking these medications   lisinopril-hydrochlorothiazide 10-12.5 MG tablet Commonly known as:  PRINZIDE,ZESTORETIC     TAKE these medications   ARTIFICIAL TEARS 0.1-0.3 % Soln Generic drug:  Dextran 70-Hypromellose Place 1 drop into both eyes every 4 (four) hours as needed (for dry eyes).   ELIQUIS STARTER PACK 5 MG Tabs Take as directed on package: start with two-5mg  tablets twice daily for 7 days. On day 8, switch to one-5mg  tablet twice daily.   latanoprost 0.005 % ophthalmic solution Commonly known as:  XALATAN Place 1 drop into the right eye at bedtime.   oxyCODONE-acetaminophen 5-325 MG tablet Commonly known as:  PERCOCET/ROXICET Take 1 tablet by mouth every 4 (four) hours as needed for moderate pain.   senna-docusate 8.6-50 MG tablet Commonly known as:  Senokot-S Take 1 tablet by mouth 2 (two) times daily.        DISCHARGE INSTRUCTIONS:    Follow with cancer center in 2 week.  If you experience worsening of your  admission symptoms, develop shortness of breath, life threatening emergency, suicidal or homicidal thoughts you must seek medical attention immediately by calling 911 or calling your MD immediately  if symptoms less severe.  You Must read complete instructions/literature along with all the possible adverse reactions/side effects for all the Medicines you take and that have been prescribed to you. Take any new Medicines after you have completely understood and accept all the possible adverse reactions/side effects.   Please note  You were cared for by a hospitalist during your hospital stay. If you have any questions about your discharge medications or the care you received while you were in the hospital after you are discharged, you can call the unit and asked to speak with the hospitalist on call if the hospitalist that took care of you is not available. Once you are discharged, your primary care physician will handle any further medical issues. Please note that NO REFILLS for any discharge medications will be authorized once you are discharged, as it is imperative that you return to your primary care physician (or establish a relationship with a primary care physician if you do not have one) for your aftercare needs so that they can reassess your need for medications and monitor your lab values.    Today   CHIEF COMPLAINT:   Chief Complaint  Patient presents with  . Abdominal Pain    HISTORY OF PRESENT ILLNESS:  Tyrone Coffey  is a 72 y.o. male with a known history of arthritis, hypertension, pulmonary embolism in 2013 and no provoking reasons were found at that time-was on Coumadin for 3 to 4 months following that and stopped after that.  Had regular colonoscopies and last was 1 year ago without any significant findings. He had pleuritic chest pain on the right side for last 2 to 3 days along with some cough-so he went to urgent care center today and they sent to the hospital due to history  of pulmonary embolism. CT scan angiogram was done which showed multiple pulmonary emboli in both lungs with right heart strain.  ER physician started on heparin drip and advised to medical services. On further questioning patient denies any family history of blood clots, denies any recent surgery or long-term travel or driving.  He confirms that he is "couch potato" sits and watch TV 3 to 4 hours every day without getting up in between.   VITAL SIGNS:  Blood pressure (!) 136/116, pulse (!) 51, temperature 97.6 F (36.4 C), temperature source Oral, resp. rate 19, height 5\' 11"  (1.803 m), weight 90.1 kg, SpO2 99 %.  I/O:    Intake/Output Summary (Last 24 hours) at 04/24/2018 1743 Last data filed at 04/24/2018 1300 Gross per 24 hour  Intake 824.66 ml  Output 2400 ml  Net -1575.34 ml    PHYSICAL EXAMINATION:  GENERAL:  72 y.o.-year-old patient lying in the bed with no acute distress.  EYES: Pupils equal, round, reactive to light and accommodation. No scleral icterus. Extraocular muscles intact.  HEENT: Head atraumatic, normocephalic. Oropharynx and nasopharynx clear.  NECK:  Supple, no jugular venous distention. No thyroid enlargement, no tenderness.  LUNGS:  Normal breath sounds bilaterally, no wheezing, rales,rhonchi or crepitation. No use of accessory muscles of respiration.  CARDIOVASCULAR: S1, S2 normal. No murmurs, rubs, or gallops.  ABDOMEN: Soft, non-tender, non-distended. Bowel sounds present. No organomegaly or mass.  EXTREMITIES: No pedal edema, cyanosis, or clubbing.  NEUROLOGIC: Cranial nerves II through XII are intact. Muscle strength 5/5 in all extremities. Sensation intact. Gait not checked.  PSYCHIATRIC: The patient is alert and oriented x 3.  SKIN: No obvious rash, lesion, or ulcer.   DATA REVIEW:   CBC Recent Labs  Lab 04/24/18 0325  WBC 5.3  HGB 13.5  HCT 40.7  PLT 167    Chemistries  Recent Labs  Lab 04/22/18 0950 04/23/18 0459  NA 138 137  K 4.2 3.8   CL 99 103  CO2 28 27  GLUCOSE 105* 103*  BUN 15 9  CREATININE 0.97 0.77  CALCIUM 9.6 8.8*  AST 18  --   ALT 17  --   ALKPHOS 73  --   BILITOT 1.1  --     Cardiac Enzymes Recent Labs  Lab 04/22/18 1145  TROPONINI <0.03    Microbiology Results  No results found for this or any previous visit.  RADIOLOGY:  US Venous Img Lower Bilateral  Result Date: 04/23/2018 CLINICAL DATA:  Pulmonary emboli on recent CT.  Shortness of breath. EXAM: BILATERAL LOWER EXTREMITY VENOUS DOPPLER ULTRASOUND TECHNIQUE: Gray-scale sonography with compression, as well as color and duplex ultrasound, were performed to evaluate the deep venous system from the level of the common femoral vein through the popliteal and proximal calf veins. COMPARISON:  None FINDINGS: On the left, normal compressibility of the common femoral, superficial femoral, and popliteal veins, as well as the proximal calf veins. No filling defects to suggest DVT on grayscale or color Doppler imaging. Doppler waveforms show normal direction of venous flow, normal respiratory phasicity and response to augmentation. On the right, common femoral, deep femoral, and femoral veins show normal compressibility and color flow signal. The distal popliteal vein is incompletely compressible with some convincing intraluminal echogenic thrombus. Color flow signal is seen through this segment. Visualized calf veins appear compressible. IMPRESSION: 1. POSITIVE for incompletely occlusive DVT in the distal right popliteal vein. 2. Negative for left lower extremity DVT. Electronically Signed   By: Corlis Leak M.D.   On: 04/23/2018 15:53    EKG:   Orders placed or performed during the hospital encounter of 04/22/18  . EKG 12-Lead  . EKG 12-Lead      Management plans discussed with the patient, family and they are in agreement.  CODE STATUS:     Code Status Orders  (From admission, onward)         Start     Ordered   04/22/18 1508  Full code   Continuous     04/22/18 1507        Code Status History    This patient has a current code status but no historical code status.    Advance Directive Documentation     Most Recent Value  Type of Advance Directive  Healthcare Power of Attorney  Pre-existing out of facility DNR order (yellow form or pink MOST form)  -  "MOST" Form in Place?  -      TOTAL TIME TAKING CARE OF THIS PATIENT: 35 minutes.    Altamese Dilling M.D on 04/24/2018 at 5:43 PM  Between 7am to 6pm - Pager - [phone]  After 6pm go to [email or web address] - password  [email or web address]  [facility]  Office  [phone]  CC: Primary care physician; Sotolongo, Christiana Pellant, MD   Note: This dictation was prepared with Dragon dictation along with smaller phrase technology. Any transcriptional errors that result from this process are unintentional.

## 2018-04-24 NOTE — Progress Notes (Signed)
Printed multiple educational sheets for patient and went through them with the patient and his wife. Patient now has better understanding of how to manage after having blood clots and what he can do to help prevent them. IV's removed. Belonging gathered. Patient d/c to home with wife.

## 2018-05-05 ENCOUNTER — Encounter: Payer: Self-pay | Admitting: Oncology

## 2018-05-05 ENCOUNTER — Other Ambulatory Visit: Payer: Self-pay

## 2018-05-05 ENCOUNTER — Inpatient Hospital Stay: Payer: Medicare Other | Attending: Oncology | Admitting: Oncology

## 2018-05-05 VITALS — BP 112/74 | HR 83 | Temp 96.1°F | Resp 18 | Ht 71.0 in | Wt 197.9 lb

## 2018-05-05 DIAGNOSIS — Z87891 Personal history of nicotine dependence: Secondary | ICD-10-CM | POA: Diagnosis not present

## 2018-05-05 DIAGNOSIS — I2699 Other pulmonary embolism without acute cor pulmonale: Secondary | ICD-10-CM | POA: Insufficient documentation

## 2018-05-05 DIAGNOSIS — Z79899 Other long term (current) drug therapy: Secondary | ICD-10-CM

## 2018-05-05 DIAGNOSIS — Z7901 Long term (current) use of anticoagulants: Secondary | ICD-10-CM | POA: Diagnosis not present

## 2018-05-05 DIAGNOSIS — I10 Essential (primary) hypertension: Secondary | ICD-10-CM | POA: Insufficient documentation

## 2018-05-05 DIAGNOSIS — R779 Abnormality of plasma protein, unspecified: Secondary | ICD-10-CM

## 2018-05-05 DIAGNOSIS — I82431 Acute embolism and thrombosis of right popliteal vein: Secondary | ICD-10-CM | POA: Diagnosis not present

## 2018-05-05 NOTE — Progress Notes (Signed)
Patient here for initial visit. °

## 2018-05-05 NOTE — Progress Notes (Signed)
Hematology/Oncology Follow Up Note [facility]  Telephone:([phone] Fax:([phone]  Patient Care Team: Eusebio Me, MD as PCP - General (Family Medicine)   Name of the patient: Tyrone Coffey  [id number]  [birth year]   REASON FOR VISIT Follow up for management of pulmonary embolism and DVT  PERTINENT HEM/ONC HISTORY 72 y.o. male with PMH listed at below who presents to ER for evaluation of pleuritic chest pain on the right side for the past few days.  He went to urgent care today in the send the patient to the hospital due to history of pulmonary embolism. CT angiogram was done which showed multiple pulmonary emboli in both lungs with right heart strain.  Heparin drip was started and patient is admitted for further evaluation and management. Lower extremity venous duplex also showed incomplete occlusive DVT in the distal right popliteal vein.   # History of recurrent PE.  He recently moved to [place] from [place].  Previous care at [facility].  Medical records reviewed. 11/13/2011 he developed extensive bilateral pulmonary emboli with evidence of right heart strain and possible lingular infarct.  At that time, he also had nonconclusive noncompressible thrombus in the left popliteal vein.  Patient reports that he Coumadin for couple of months and chemo. Per patient, this happened after hip revision surgery. Prior to his PE/DVT episodes in 2013, he also reports right upper extremity clots that developed after right shoulder history.  Reports that he took Coumadin for very short period of time.  Records not available to me at this point.  Patient denies any trauma, recent surgery, long distance car trip or air flight. Patient was discharged on Eliquis starting kit.   INTERVAL HISTORY 72 y.o. male with above hematology history reviewed by me presents for follow up of recurrent Pulmonary embolism and DVT.  He has finished 7 days of Eliquis 17m  BID and transitioned to 53mBID.  So far tolerates well.Denies hematochezia, hematuria, hematemesis, epistaxis, black tarry stool or easy bruising.   Reports having lower back pain which started a few days ago, moved to his left lowe abdomen and then moved to his left flank, no aggravating or alleviating factors, not worsened with deep breath. Reports 3-4 out of 10.  Denies any shortness of breath. During his admission, he had right side pruritic pain which has resolved.   Review of Systems  Constitutional: Negative for fever and malaise/fatigue.  HENT: Negative for nosebleeds and sore throat.   Eyes: Negative for double vision, photophobia and redness.  Respiratory: Negative for cough, shortness of breath and wheezing.   Cardiovascular: Negative for chest pain, palpitations and orthopnea.       Left posterior chest/flank pain.   Gastrointestinal: Negative for abdominal pain, blood in stool, nausea and vomiting.  Genitourinary: Negative for dysuria.  Musculoskeletal: Negative for back pain, myalgias and neck pain.  Skin: Negative for itching and rash.  Neurological: Negative for dizziness, tingling and tremors.  Endo/Heme/Allergies: Negative for environmental allergies. Does not bruise/bleed easily.  Psychiatric/Behavioral: Negative for depression.      Allergies  Allergen Reactions  . Amoxicillin Nausea Only    Has patient had a PCN reaction causing immediate rash, facial/tongue/throat swelling, SOB or lightheadedness with hypotension: No Has patient had a PCN reaction causing severe rash involving mucus membranes or skin necrosis: No Has patient had a PCN reaction that required hospitalization: No Has patient had a PCN reaction occurring within the last 10 years: Yes If all of  the above answers are "NO", then may proceed with Cephalosporin use.     Past Medical History:  Diagnosis Date  . Arthritis   . Hypertension   . Pulmonary embolism [facility])      Past Surgical History:    Procedure Laterality Date  . c2 - 3 vertebrae fuse    . JOINT REPLACEMENT    . left hip replacement     . REPLACEMENT TOTAL KNEE BILATERAL    . right shoulder replacement      Social History   Socioeconomic History  . Marital status: Married    Spouse name: [name]  . Number of children: Not on file  . Years of education: Not on file  . Highest education level: Not on file  Occupational History  . Occupation: retired  Scientific laboratory technician  . Financial resource strain: Not on file  . Food insecurity:    Worry: Not on file    Inability: Not on file  . Transportation needs:    Medical: Not on file    Non-medical: Not on file  Tobacco Use  . Smoking status: Former Smoker    Last attempt to quit: 05/05/1978    Years since quitting: 40.0  . Smokeless tobacco: Never Used  . Tobacco comment: smoked 1 pack/week for about 10 years   Substance and Sexual Activity  . Alcohol use: Yes    Comment: occasionally  . Drug use: Never  . Sexual activity: Not on file  Lifestyle  . Physical activity:    Days per week: Not on file    Minutes per session: Not on file  . Stress: Not on file  Relationships  . Social connections:    Talks on phone: Not on file    Gets together: Not on file    Attends religious service: Not on file    Active member of club or organization: Not on file    Attends meetings of clubs or organizations: Not on file    Relationship status: Not on file  . Intimate partner violence:    Fear of current or ex partner: Not on file    Emotionally abused: Not on file    Physically abused: Not on file    Forced sexual activity: Not on file  Other Topics Concern  . Not on file  Social History Narrative  . Not on file    Family History  Problem Relation Age of Onset  . Arthritis Mother   . Heart Problems Mother   . Heart Problems Father   . Lung cancer Brother   . Diabetes Brother   . Kidney disease Sister      Current Outpatient Medications:  .  Dextran  70-Hypromellose (ARTIFICIAL TEARS) 0.1-0.3 % SOLN, Place 1 drop into both eyes every 4 (four) hours as needed (for dry eyes). , Disp: , Rfl:  .  ELIQUIS STARTER PACK (ELIQUIS STARTER PACK) 5 MG TABS, Take as directed on package: start with two-15m tablets twice daily for 7 days. On day 8, switch to one-548mtablet twice daily., Disp: 1 each, Rfl: 0 .  latanoprost (XALATAN) 0.005 % ophthalmic solution, Place 1 drop into the right eye at bedtime. , Disp: , Rfl:  .  senna-docusate (SENOKOT-S) 8.6-50 MG tablet, Take 1 tablet by mouth 2 (two) times daily. (Patient taking differently: Take 1 tablet by mouth 2 (two) times daily as needed. ), Disp: 30 tablet, Rfl: 0 .  oxyCODONE-acetaminophen (PERCOCET/ROXICET) 5-325 MG tablet, Take 1 tablet by mouth every  4 (four) hours as needed for moderate pain. (Patient not taking: Reported on 05/05/2018), Disp: 20 tablet, Rfl: 0  Physical exam:  Vitals:   05/05/18 1043  BP: 112/74  Pulse: 83  Resp: 18  Temp: (!) 96.1 F (35.6 C)  TempSrc: Tympanic  Weight: 197 lb 14.4 oz (89.8 kg)  Height: 5' 11" (1.803 m)   Physical Exam  Constitutional: He is oriented to person, place, and time. No distress.  HENT:  Head: Normocephalic and atraumatic.  Mouth/Throat: Oropharynx is clear and moist.  Eyes: Pupils are equal, round, and reactive to light. EOM are normal. No scleral icterus.  Neck: Normal range of motion. Neck supple.  Cardiovascular: Normal rate, regular rhythm and normal heart sounds.  Pulmonary/Chest: Effort normal. No respiratory distress. He has no wheezes.  Abdominal: Soft. Bowel sounds are normal. He exhibits no distension and no mass. There is no tenderness.  Musculoskeletal: Normal range of motion. He exhibits no edema or deformity.  Neurological: He is alert and oriented to person, place, and time. No cranial nerve deficit. Coordination normal.  Skin: Skin is warm and dry. No rash noted. No erythema.  Psychiatric: He has a normal mood and affect.  His behavior is normal. Thought content normal.    CMP Latest Ref Rng & Units 04/23/2018  Glucose 70 - 99 mg/dL 103(H)  BUN 8 - 23 mg/dL 9  Creatinine 0.61 - 1.24 mg/dL 0.77  Sodium 135 - 145 mmol/L 137  Potassium 3.5 - 5.1 mmol/L 3.8  Chloride 98 - 111 mmol/L 103  CO2 22 - 32 mmol/L 27  Calcium 8.9 - 10.3 mg/dL 8.8(L)  Total Protein 6.5 - 8.1 g/dL -  Total Bilirubin 0.3 - 1.2 mg/dL -  Alkaline Phos 38 - 126 U/L -  AST 15 - 41 U/L -  ALT 0 - 44 U/L -   CBC Latest Ref Rng & Units 04/24/2018  WBC 4.0 - 10.5 K/uL 5.3  Hemoglobin 13.0 - 17.0 g/dL 13.5  Hematocrit 39.0 - 52.0 % 40.7  Platelets 150 - 400 K/uL 167    Ct Angio Chest Pe W And/or Wo Contrast  Result Date: 04/22/2018 CLINICAL DATA:  Shortness of breath. Right upper quadrant abdomen pain. Clinical concern for pulmonary embolism. EXAM: CT ANGIOGRAPHY CHEST WITH CONTRAST TECHNIQUE: Multidetector CT imaging of the chest was performed using the standard protocol during bolus administration of intravenous contrast. Multiplanar CT image reconstructions and MIPs were obtained to evaluate the vascular anatomy. CONTRAST:  42m ISOVUE-370 IOPAMIDOL (ISOVUE-370) INJECTION 76% COMPARISON:  Portable chest obtained earlier today. FINDINGS: Cardiovascular: Multiple moderate-sized bilateral pulmonary arterial filling defects. The right ventricular to left ventricular ratio is 1.2. Borderline enlarged heart. No pericardial effusion seen. Mediastinum/Nodes: No enlarged mediastinal, hilar, or axillary lymph nodes. Thyroid gland, trachea, and esophagus demonstrate no significant findings. Small hiatal hernia. Lungs/Pleura: Mild bilateral lower lobe dependent atelectasis. No pleural fluid. Upper Abdomen: Bilateral renal cysts. Musculoskeletal: Thoracic spine degenerative changes. Right shoulder prosthesis. Review of the MIP images confirms the above findings. IMPRESSION: 1. Positive for acute PE with CT evidence of right heart strain (RV/LV Ratio = 1.2)  consistent with at least submassive (intermediate risk) PE. The presence of right heart strain has been associated with an increased risk of morbidity and mortality. Please activate Code PE by paging [phone] 2. Small hiatal hernia. Critical Value/emergent results were called by telephone at the time of interpretation on 04/22/2018 at 11:13 am to Dr. PMerlyn Lot, who verbally acknowledged these results. Electronically Signed  By: Claudie Revering M.D.   On: 04/22/2018 11:14   US Venous Img Lower Bilateral  Result Date: 04/23/2018 CLINICAL DATA:  Pulmonary emboli on recent CT.  Shortness of breath. EXAM: BILATERAL LOWER EXTREMITY VENOUS DOPPLER ULTRASOUND TECHNIQUE: Gray-scale sonography with compression, as well as color and duplex ultrasound, were performed to evaluate the deep venous system from the level of the common femoral vein through the popliteal and proximal calf veins. COMPARISON:  None FINDINGS: On the left, normal compressibility of the common femoral, superficial femoral, and popliteal veins, as well as the proximal calf veins. No filling defects to suggest DVT on grayscale or color Doppler imaging. Doppler waveforms show normal direction of venous flow, normal respiratory phasicity and response to augmentation. On the right, common femoral, deep femoral, and femoral veins show normal compressibility and color flow signal. The distal popliteal vein is incompletely compressible with some convincing intraluminal echogenic thrombus. Color flow signal is seen through this segment. Visualized calf veins appear compressible. IMPRESSION: 1. POSITIVE for incompletely occlusive DVT in the distal right popliteal vein. 2. Negative for left lower extremity DVT. Electronically Signed   By: Lucrezia Europe M.D.   On: 04/23/2018 15:53   Dg Chest Portable 1 View  Result Date: 04/22/2018 CLINICAL DATA:  Right side chest pain. EXAM: PORTABLE CHEST 1 VIEW COMPARISON:  None. FINDINGS: Cardiomegaly with  vascular congestion. Low lung volumes with bibasilar atelectasis. No visible significant effusions or acute bony abnormality. IMPRESSION: Low lung volumes with bibasilar atelectasis. Mild cardiomegaly and vascular congestion. Electronically Signed   By: Rolm Baptise M.D.   On: 04/22/2018 10:30     Assessment and plan 1. Other acute pulmonary embolism without acute cor pulmonale ([place])   2. High serum protein level   3. Chronic anticoagulation    CT findings were independently reviewed and discussed with patient.  Discussed with patient that he needs long term anticoagulation given that he has developed recurrent PE/DVT.  Consider decrease to 2.60m BID after he completes 6-9 months of anticoagulation.  Check factor 5 leiden and prothrombin mutation.   High serum protein level, will check SPEP at next visit.   Orders Placed This Encounter  Procedures  . CBC with Differential/Platelet    Standing Status:   Future    Standing Expiration Date:   05/06/2019  . Comprehensive metabolic panel    Standing Status:   Future    Standing Expiration Date:   05/06/2019  . Factor 5 leiden    Standing Status:   Future    Standing Expiration Date:   05/06/2019  . Prothrombin gene mutation    Standing Status:   Future    Standing Expiration Date:   05/06/2019  . Multiple Myeloma Panel (SPEP&IFE w/QIG)    Standing Status:   Future    Standing Expiration Date:   05/05/2019      Follow up in 3 months.  Total face to face encounter time for this patient visit was 30 min. >50% of the time was  spent in counseling and coordination of care.  ZEarlie Server MD, PhD Hematology Oncology [facility] AProgressive Surgical Institute IncPager- [phone]/05/2018

## 2018-05-07 ENCOUNTER — Inpatient Hospital Stay: Payer: Medicare Other | Admitting: Oncology

## 2018-05-12 ENCOUNTER — Telehealth: Payer: Self-pay | Admitting: *Deleted

## 2018-05-12 NOTE — Telephone Encounter (Signed)
Patient PCP recommends he go on Vitamin D and patient is asking if that will interfere with his Eliquis. Please advise.

## 2018-05-12 NOTE — Telephone Encounter (Signed)
Ok to take vitamin D. No interaction.

## 2018-05-13 NOTE — Telephone Encounter (Signed)
Patient informed ok to take Vitamin D

## 2018-05-27 ENCOUNTER — Other Ambulatory Visit: Payer: Self-pay | Admitting: Oncology

## 2018-05-27 ENCOUNTER — Other Ambulatory Visit: Payer: Self-pay | Admitting: *Deleted

## 2018-05-27 MED ORDER — APIXABAN 5 MG PO TABS: 5.0000 mg | ORAL_TABLET | Freq: Two times a day (BID) | ORAL | 2 refills | Status: DC

## 2018-05-27 NOTE — Telephone Encounter (Signed)
Patient has been out of medicine for 1.5 days unable to get a response from office per patient. Please refill ASAP

## 2018-08-04 ENCOUNTER — Inpatient Hospital Stay (HOSPITAL_BASED_OUTPATIENT_CLINIC_OR_DEPARTMENT_OTHER): Payer: Medicare Other | Admitting: Oncology

## 2018-08-04 ENCOUNTER — Inpatient Hospital Stay: Payer: Medicare Other | Attending: Oncology

## 2018-08-04 ENCOUNTER — Other Ambulatory Visit: Payer: Self-pay

## 2018-08-04 ENCOUNTER — Encounter: Payer: Self-pay | Admitting: Oncology

## 2018-08-04 VITALS — BP 116/79 | HR 71 | Temp 96.5°F | Resp 18 | Wt 200.9 lb

## 2018-08-04 DIAGNOSIS — R779 Abnormality of plasma protein, unspecified: Secondary | ICD-10-CM | POA: Insufficient documentation

## 2018-08-04 DIAGNOSIS — Z86711 Personal history of pulmonary embolism: Secondary | ICD-10-CM

## 2018-08-04 DIAGNOSIS — Z7901 Long term (current) use of anticoagulants: Secondary | ICD-10-CM | POA: Insufficient documentation

## 2018-08-04 DIAGNOSIS — Z79899 Other long term (current) drug therapy: Secondary | ICD-10-CM | POA: Diagnosis not present

## 2018-08-04 DIAGNOSIS — Z87891 Personal history of nicotine dependence: Secondary | ICD-10-CM | POA: Insufficient documentation

## 2018-08-04 DIAGNOSIS — I10 Essential (primary) hypertension: Secondary | ICD-10-CM | POA: Insufficient documentation

## 2018-08-04 DIAGNOSIS — I2699 Other pulmonary embolism without acute cor pulmonale: Secondary | ICD-10-CM

## 2018-08-04 LAB — COMPREHENSIVE METABOLIC PANEL
ALK PHOS: 59 U/L (ref 38–126)
ALT: 23 U/L (ref 0–44)
AST: 20 U/L (ref 15–41)
Albumin: 4.1 g/dL (ref 3.5–5.0)
Anion gap: 6 (ref 5–15)
BILIRUBIN TOTAL: 0.7 mg/dL (ref 0.3–1.2)
BUN: 14 mg/dL (ref 8–23)
CALCIUM: 9.2 mg/dL (ref 8.9–10.3)
CO2: 30 mmol/L (ref 22–32)
CREATININE: 0.73 mg/dL (ref 0.61–1.24)
Chloride: 105 mmol/L (ref 98–111)
GFR calc Af Amer: >60 mL/min (ref >60)
Glucose, Bld: 102 mg/dL — ABNORMAL HIGH (ref 70–99)
POTASSIUM: 3.7 mmol/L (ref 3.5–5.1)
Sodium: 141 mmol/L (ref 135–145)
TOTAL PROTEIN: 7.3 g/dL (ref 6.5–8.1)

## 2018-08-04 LAB — CBC WITH DIFFERENTIAL/PLATELET
Abs Immature Granulocytes: 0.01 10*3/uL (ref 0.00–0.07)
Basophils Absolute: 0 10*3/uL (ref 0.0–0.1)
Basophils Relative: 1 %
EOS PCT: 2 %
Eosinophils Absolute: 0.1 10*3/uL (ref 0.0–0.5)
HEMATOCRIT: 45.1 % (ref 39.0–52.0)
Hemoglobin: 14.9 g/dL (ref 13.0–17.0)
IMMATURE GRANULOCYTES: 0 %
LYMPHS ABS: 1.6 10*3/uL (ref 0.7–4.0)
Lymphocytes Relative: 43 %
MCH: 29.3 pg (ref 26.0–34.0)
MCHC: 33 g/dL (ref 30.0–36.0)
MCV: 88.8 fL (ref 80.0–100.0)
MONOS PCT: 7 %
Monocytes Absolute: 0.3 10*3/uL (ref 0.1–1.0)
NEUTROS PCT: 47 %
Neutro Abs: 1.8 10*3/uL (ref 1.7–7.7)
Platelets: 190 10*3/uL (ref 150–400)
RBC: 5.08 MIL/uL (ref 4.22–5.81)
RDW: 12.1 % (ref 11.5–15.5)
WBC: 3.8 10*3/uL — ABNORMAL LOW (ref 4.0–10.5)
nRBC: 0 % (ref 0.0–0.2)

## 2018-08-04 NOTE — Progress Notes (Signed)
Hematology/Oncology Follow Up Note [facility]  Telephone:([phone] Fax:([phone]  Patient Care Team: Eusebio Me, MD as PCP - General (Family Medicine)   Name of the patient: Tyrone Coffey  [id number]  04/02/1946   REASON FOR VISIT Follow up for management of pulmonary embolism and DVT  PERTINENT HEM/ONC HISTORY 73 y.o. male with PMH listed at below who presents to ER for evaluation of pleuritic chest pain on the right side for the past few days.  He went to urgent care today in the send the patient to the hospital due to history of pulmonary embolism. CT angiogram was done which showed multiple pulmonary emboli in both lungs with right heart strain.  Heparin drip was started and patient is admitted for further evaluation and management. Lower extremity venous duplex also showed incomplete occlusive DVT in the distal right popliteal vein.   # History of recurrent PE.  He recently moved to [place] from [place].  Previous care at [facility].  Medical records reviewed. 11/13/2011 he developed extensive bilateral pulmonary emboli with evidence of right heart strain and possible lingular infarct.  At that time, he also had nonconclusive noncompressible thrombus in the left popliteal vein.  Patient reports that he Coumadin for couple of months and chemo. Per patient, this happened after hip revision surgery. Prior to his PE/DVT episodes in 2013, he also reports right upper extremity clots that developed after right shoulder history.  Reports that he took Coumadin for very short period of time.  Records not available to me at this point.  Patient denies any trauma, recent surgery, long distance car trip or air flight. Patient was discharged on Eliquis starting kit.   INTERVAL HISTORY 73 y.o. male with above hematology history reviewed by me presents for follow up of recurrent Pulmonary embolism and DVT.  Patient is on Eliquis 5 mg twice  daily. Reports tolerating anticoagulation.Denies hematochezia, hematuria, hematemesis, epistaxis, black tarry stool or easy bruising.  Denies any shortness of breath, chest pain lower extremity swelling, abdominal pain today.   Review of Systems  Constitutional: Negative for chills, fever, malaise/fatigue and weight loss.  HENT: Negative for nosebleeds and sore throat.   Eyes: Negative for double vision, photophobia and redness.  Respiratory: Negative for cough, shortness of breath and wheezing.   Cardiovascular: Negative for chest pain, palpitations, orthopnea and leg swelling.  Gastrointestinal: Negative for abdominal pain, blood in stool, nausea and vomiting.  Genitourinary: Negative for dysuria.  Musculoskeletal: Negative for back pain, myalgias and neck pain.  Skin: Negative for itching and rash.  Neurological: Negative for dizziness, tingling and tremors.  Endo/Heme/Allergies: Negative for environmental allergies. Does not bruise/bleed easily.  Psychiatric/Behavioral: Negative for depression and hallucinations.      Allergies  Allergen Reactions  . Amoxicillin Nausea Only    Has patient had a PCN reaction causing immediate rash, facial/tongue/throat swelling, SOB or lightheadedness with hypotension: No Has patient had a PCN reaction causing severe rash involving mucus membranes or skin necrosis: No Has patient had a PCN reaction that required hospitalization: No Has patient had a PCN reaction occurring within the last 10 years: Yes If all of the above answers are "NO", then may proceed with Cephalosporin use.     Past Medical History:  Diagnosis Date  . Arthritis   . Hypertension   . Pulmonary embolism [facility])      Past Surgical History:  Procedure Laterality Date  . c2 - 3 vertebrae fuse    .  JOINT REPLACEMENT    . left hip replacement     . REPLACEMENT TOTAL KNEE BILATERAL    . right shoulder replacement      Social History   Socioeconomic History  . Marital  status: Married    Spouse name: [name]  . Number of children: Not on file  . Years of education: Not on file  . Highest education level: Not on file  Occupational History  . Occupation: retired  Scientific laboratory technician  . Financial resource strain: Not on file  . Food insecurity:    Worry: Not on file    Inability: Not on file  . Transportation needs:    Medical: Not on file    Non-medical: Not on file  Tobacco Use  . Smoking status: Former Smoker    Last attempt to quit: 05/05/1978    Years since quitting: 40.2  . Smokeless tobacco: Never Used  . Tobacco comment: smoked 1 pack/week for about 10 years   Substance and Sexual Activity  . Alcohol use: Yes    Comment: occasionally  . Drug use: Never  . Sexual activity: Not on file  Lifestyle  . Physical activity:    Days per week: Not on file    Minutes per session: Not on file  . Stress: Not on file  Relationships  . Social connections:    Talks on phone: Not on file    Gets together: Not on file    Attends religious service: Not on file    Active member of club or organization: Not on file    Attends meetings of clubs or organizations: Not on file    Relationship status: Not on file  . Intimate partner violence:    Fear of current or ex partner: Not on file    Emotionally abused: Not on file    Physically abused: Not on file    Forced sexual activity: Not on file  Other Topics Concern  . Not on file  Social History Narrative  . Not on file    Family History  Problem Relation Age of Onset  . Arthritis Mother   . Heart Problems Mother   . Heart Problems Father   . Lung cancer Brother   . Diabetes Brother   . Kidney disease Sister      Current Outpatient Medications:  .  apixaban (ELIQUIS) 5 MG TABS tablet, Take 1 tablet (5 mg total) by mouth 2 (two) times daily., Disp: 60 tablet, Rfl: 2 .  Dextran 70-Hypromellose (ARTIFICIAL TEARS) 0.1-0.3 % SOLN, Place 1 drop into both eyes every 4 (four) hours as needed (for dry  eyes). , Disp: , Rfl:  .  latanoprost (XALATAN) 0.005 % ophthalmic solution, Place 1 drop into the right eye at bedtime. , Disp: , Rfl:  .  lisinopril-hydrochlorothiazide (PRINZIDE,ZESTORETIC) 10-12.5 MG tablet, Take 1 tablet by mouth daily., Disp: , Rfl:  .  oxyCODONE-acetaminophen (PERCOCET/ROXICET) 5-325 MG tablet, Take 1 tablet by mouth every 4 (four) hours as needed for moderate pain., Disp: 20 tablet, Rfl: 0 .  senna-docusate (SENOKOT-S) 8.6-50 MG tablet, Take 1 tablet by mouth 2 (two) times daily. (Patient taking differently: Take 1 tablet by mouth 2 (two) times daily as needed. ), Disp: 30 tablet, Rfl: 0  Physical exam:  Vitals:   08/04/18 1034  BP: 116/79  Pulse: 71  Resp: 18  Temp: (!) 96.5 F (35.8 C)  TempSrc: Tympanic  Weight: 200 lb 14.4 oz (91.1 kg)   Physical Exam Constitutional:  General: He is not in acute distress. HENT:     Head: Normocephalic and atraumatic.  Eyes:     General: No scleral icterus.    Pupils: Pupils are equal, round, and reactive to light.  Neck:     Musculoskeletal: Normal range of motion and neck supple.  Cardiovascular:     Rate and Rhythm: Normal rate and regular rhythm.     Heart sounds: Normal heart sounds.  Pulmonary:     Effort: Pulmonary effort is normal. No respiratory distress.     Breath sounds: No wheezing.  Abdominal:     General: Bowel sounds are normal. There is no distension.     Palpations: Abdomen is soft. There is no mass.     Tenderness: There is no abdominal tenderness.  Musculoskeletal: Normal range of motion.        General: No deformity.  Skin:    General: Skin is warm and dry.     Findings: No erythema or rash.  Neurological:     Mental Status: He is alert and oriented to person, place, and time.     Cranial Nerves: No cranial nerve deficit.     Coordination: Coordination normal.  Psychiatric:        Behavior: Behavior normal.        Thought Content: Thought content normal.     CMP Latest Ref Rng &  Units 08/04/2018  Glucose 70 - 99 mg/dL 102(H)  BUN 8 - 23 mg/dL 14  Creatinine 0.61 - 1.24 mg/dL 0.73  Sodium 135 - 145 mmol/L 141  Potassium 3.5 - 5.1 mmol/L 3.7  Chloride 98 - 111 mmol/L 105  CO2 22 - 32 mmol/L 30  Calcium 8.9 - 10.3 mg/dL 9.2  Total Protein 6.5 - 8.1 g/dL 7.3  Total Bilirubin 0.3 - 1.2 mg/dL 0.7  Alkaline Phos 38 - 126 U/L 59  AST 15 - 41 U/L 20  ALT 0 - 44 U/L 23   CBC Latest Ref Rng & Units 08/04/2018  WBC 4.0 - 10.5 K/uL 3.8(L)  Hemoglobin 13.0 - 17.0 g/dL 14.9  Hematocrit 39.0 - 52.0 % 45.1  Platelets 150 - 400 K/uL 190    Assessment and plan 1. History of pulmonary embolism   2. Chronic anticoagulation   3. High serum protein level    #Tolerating chronic anticoagulation with Eliquis 5 mg twice daily. Recommend to continue anticoagulation to complete total of 6 months of anticoagulation. Patient will be reevaluated at that point and will consider decrease to 2.5 mg twice daily. Will check his factor V Leiden and prothrombin mutation as part of hypercoagulation work-up.  #High serum protein level, check multiple myeloma panel.  Orders Placed This Encounter  Procedures  . CBC with Differential/Platelet    Standing Status:   Future    Standing Expiration Date:   08/05/2019  . Comprehensive metabolic panel    Standing Status:   Future    Standing Expiration Date:   08/05/2019      Follow up in 3 months.   Earlie Server, MD, PhD Hematology Oncology [facility] Pager- [phone] 08/04/2018

## 2018-08-04 NOTE — Progress Notes (Signed)
Patient here for follow up. No concerns voiced.  °

## 2018-08-06 LAB — MULTIPLE MYELOMA PANEL, SERUM
ALBUMIN/GLOB SERPL: 1.3 (ref 0.7–1.7)
ALPHA 1: 0.2 g/dL (ref 0.0–0.4)
ALPHA2 GLOB SERPL ELPH-MCNC: 0.7 g/dL (ref 0.4–1.0)
Albumin SerPl Elph-Mcnc: 3.9 g/dL (ref 2.9–4.4)
B-GLOBULIN SERPL ELPH-MCNC: 1.2 g/dL (ref 0.7–1.3)
GLOBULIN, TOTAL: 3.1 g/dL (ref 2.2–3.9)
Gamma Glob SerPl Elph-Mcnc: 1 g/dL (ref 0.4–1.8)
IGG (IMMUNOGLOBIN G), SERUM: 1106 mg/dL (ref 700–1600)
IgA: 411 mg/dL (ref 61–437)
IgM (Immunoglobulin M), Srm: 8 mg/dL — ABNORMAL LOW (ref 15–143)
TOTAL PROTEIN ELP: 7 g/dL (ref 6.0–8.5)

## 2018-08-07 LAB — FACTOR 5 LEIDEN

## 2018-08-10 LAB — PROTHROMBIN GENE MUTATION

## 2018-10-08 ENCOUNTER — Other Ambulatory Visit: Payer: Self-pay | Admitting: *Deleted

## 2018-10-08 MED ORDER — APIXABAN 5 MG PO TABS: 5.0000 mg | ORAL_TABLET | Freq: Two times a day (BID) | ORAL | 0 refills | Status: DC

## 2018-10-09 ENCOUNTER — Telehealth: Payer: Self-pay | Admitting: *Deleted

## 2018-10-09 NOTE — Telephone Encounter (Signed)
Refill was sent yesterday by Dr. Cathie Hoops.

## 2018-10-13 ENCOUNTER — Telehealth: Payer: Self-pay | Admitting: *Deleted

## 2018-10-13 NOTE — Telephone Encounter (Signed)
Patient called about prescription refill for Eliquis.  Patient changed pharmacies, no longer [facility] in [place], now [facility] in [place].  Called [facility], had rx transferred as requested.  Chart updated to reflect change.

## 2018-10-26 ENCOUNTER — Other Ambulatory Visit: Payer: Self-pay

## 2018-10-27 ENCOUNTER — Inpatient Hospital Stay: Payer: Medicare Other | Attending: Oncology | Admitting: *Deleted

## 2018-10-27 ENCOUNTER — Ambulatory Visit: Payer: Medicare Other | Admitting: Oncology

## 2018-10-27 ENCOUNTER — Inpatient Hospital Stay (HOSPITAL_BASED_OUTPATIENT_CLINIC_OR_DEPARTMENT_OTHER): Payer: Medicare Other | Admitting: Oncology

## 2018-10-27 ENCOUNTER — Other Ambulatory Visit: Payer: Medicare Other

## 2018-10-27 ENCOUNTER — Other Ambulatory Visit: Payer: Self-pay

## 2018-10-27 DIAGNOSIS — Z86711 Personal history of pulmonary embolism: Secondary | ICD-10-CM | POA: Diagnosis present

## 2018-10-27 DIAGNOSIS — Z880 Allergy status to penicillin: Secondary | ICD-10-CM | POA: Insufficient documentation

## 2018-10-27 DIAGNOSIS — Z7289 Other problems related to lifestyle: Secondary | ICD-10-CM | POA: Diagnosis not present

## 2018-10-27 DIAGNOSIS — Z801 Family history of malignant neoplasm of trachea, bronchus and lung: Secondary | ICD-10-CM | POA: Diagnosis not present

## 2018-10-27 DIAGNOSIS — Z833 Family history of diabetes mellitus: Secondary | ICD-10-CM | POA: Diagnosis not present

## 2018-10-27 DIAGNOSIS — Z86718 Personal history of other venous thrombosis and embolism: Secondary | ICD-10-CM | POA: Insufficient documentation

## 2018-10-27 DIAGNOSIS — Z7901 Long term (current) use of anticoagulants: Secondary | ICD-10-CM | POA: Diagnosis not present

## 2018-10-27 DIAGNOSIS — Z8261 Family history of arthritis: Secondary | ICD-10-CM | POA: Insufficient documentation

## 2018-10-27 DIAGNOSIS — Z841 Family history of disorders of kidney and ureter: Secondary | ICD-10-CM | POA: Diagnosis not present

## 2018-10-27 DIAGNOSIS — Z87891 Personal history of nicotine dependence: Secondary | ICD-10-CM | POA: Insufficient documentation

## 2018-10-27 DIAGNOSIS — Z8249 Family history of ischemic heart disease and other diseases of the circulatory system: Secondary | ICD-10-CM | POA: Diagnosis not present

## 2018-10-27 DIAGNOSIS — M1990 Unspecified osteoarthritis, unspecified site: Secondary | ICD-10-CM | POA: Diagnosis not present

## 2018-10-27 LAB — CBC WITH DIFFERENTIAL/PLATELET
Abs Immature Granulocytes: 0.02 10*3/uL (ref 0.00–0.07)
Basophils Absolute: 0 10*3/uL (ref 0.0–0.1)
Basophils Relative: 1 %
Eosinophils Absolute: 0.1 10*3/uL (ref 0.0–0.5)
Eosinophils Relative: 2 %
HCT: 45.8 % (ref 39.0–52.0)
Hemoglobin: 15.5 g/dL (ref 13.0–17.0)
Immature Granulocytes: 1 %
Lymphocytes Relative: 37 %
Lymphs Abs: 1.4 10*3/uL (ref 0.7–4.0)
MCH: 30.3 pg (ref 26.0–34.0)
MCHC: 33.8 g/dL (ref 30.0–36.0)
MCV: 89.6 fL (ref 80.0–100.0)
Monocytes Absolute: 0.3 10*3/uL (ref 0.1–1.0)
Monocytes Relative: 6 %
Neutro Abs: 2.1 10*3/uL (ref 1.7–7.7)
Neutrophils Relative %: 53 %
Platelets: 167 10*3/uL (ref 150–400)
RBC: 5.11 MIL/uL (ref 4.22–5.81)
RDW: 12.2 % (ref 11.5–15.5)
WBC: 3.9 10*3/uL — ABNORMAL LOW (ref 4.0–10.5)
nRBC: 0 % (ref 0.0–0.2)

## 2018-10-27 LAB — COMPREHENSIVE METABOLIC PANEL
ALT: 20 U/L (ref 0–44)
AST: 22 U/L (ref 15–41)
Albumin: 4.4 g/dL (ref 3.5–5.0)
Alkaline Phosphatase: 59 U/L (ref 38–126)
Anion gap: 10 (ref 5–15)
BUN: 14 mg/dL (ref 8–23)
CO2: 26 mmol/L (ref 22–32)
Calcium: 9.4 mg/dL (ref 8.9–10.3)
Chloride: 102 mmol/L (ref 98–111)
Creatinine, Ser: 0.94 mg/dL (ref 0.61–1.24)
GFR calc Af Amer: >60 mL/min (ref >60)
GFR calc non Af Amer: >60 mL/min (ref >60)
Glucose, Bld: 157 mg/dL — ABNORMAL HIGH (ref 70–99)
Potassium: 3.7 mmol/L (ref 3.5–5.1)
Sodium: 138 mmol/L (ref 135–145)
Total Bilirubin: 0.9 mg/dL (ref 0.3–1.2)
Total Protein: 7.7 g/dL (ref 6.5–8.1)

## 2018-10-27 NOTE — Progress Notes (Signed)
Called patient for Telehealth visit via [employer].  Patient states no new concerns today.  

## 2018-10-28 NOTE — Progress Notes (Addendum)
HEMATOLOGY-ONCOLOGY TeleHEALTH VISIT PROGRESS NOTE  I connected with Tyrone Coffey on 10/27/18 at  1:15 PM EDT by video enabled telemedicine visit and verified that I am speaking with the correct person using two identifiers. I discussed the limitations, risks, security and privacy concerns of performing an evaluation and management service by telemedicine and the availability of in-person appointments. I also discussed with the patient that there may be a patient responsible charge related to this service. The patient expressed understanding and agreed to proceed.   Other persons participating in the visit and their role in the encounter:  Geraldine Solar, [place], check in patient   Janeann Merl, RN, check in patient.   Patient's location: Home  Provider's location: Work Risk analyst Complaint: Management of recurrent PE and DVT.   INTERVAL HISTORY Tyrone Coffey is a 73 y.o. male who has above history reviewed by me today presents for follow up visit for management of recurrent PE and DVT, chronic anticoagulation on Eliquis 5 mg twice daily. Problems and complaints are listed below:  Patient reports feeling well at baseline.  His breathing is good.  Appetite is good. He supposed to be on Eliquis 5 mg twice daily.  Recently he self decreased to 5 mg daily.  Tolerating anticoagulation well.  Denies any acute bleeding or easy bruising. Denies any shortness of breath, chest pain, lower extremity swelling, abdominal pain today.   Review of Systems  Constitutional: Negative for appetite change, chills, fatigue, fever and unexpected weight change.  HENT:   Negative for hearing loss and voice change.   Eyes: Negative for eye problems and icterus.  Respiratory: Negative for chest tightness, cough and shortness of breath.   Cardiovascular: Negative for chest pain and leg swelling.  Gastrointestinal: Negative for abdominal distention and abdominal pain.  Endocrine: Negative for hot flashes.   Genitourinary: Negative for difficulty urinating, dysuria and frequency.   Musculoskeletal: Negative for arthralgias.  Skin: Negative for itching and rash.  Neurological: Negative for light-headedness and numbness.  Hematological: Negative for adenopathy. Does not bruise/bleed easily.  Psychiatric/Behavioral: Negative for confusion.    Past Medical History:  Diagnosis Date  . Arthritis   . Hypertension   . Pulmonary embolism [facility])    Past Surgical History:  Procedure Laterality Date  . c2 - 3 vertebrae fuse    . JOINT REPLACEMENT    . left hip replacement     . REPLACEMENT TOTAL KNEE BILATERAL    . right shoulder replacement      Family History  Problem Relation Age of Onset  . Arthritis Mother   . Heart Problems Mother   . Heart Problems Father   . Lung cancer Brother   . Diabetes Brother   . Kidney disease Sister     Social History   Socioeconomic History  . Marital status: Married    Spouse name: [name]  . Number of children: Not on file  . Years of education: Not on file  . Highest education level: Not on file  Occupational History  . Occupation: retired  Scientific laboratory technician  . Financial resource strain: Not on file  . Food insecurity:    Worry: Not on file    Inability: Not on file  . Transportation needs:    Medical: Not on file    Non-medical: Not on file  Tobacco Use  . Smoking status: Former Smoker    Last attempt to quit: 05/05/1978    Years since quitting: 40.5  . Smokeless tobacco: Never Used  .  Tobacco comment: smoked 1 pack/week for about 10 years   Substance and Sexual Activity  . Alcohol use: Yes    Comment: occasionally  . Drug use: Never  . Sexual activity: Not on file  Lifestyle  . Physical activity:    Days per week: Not on file    Minutes per session: Not on file  . Stress: Not on file  Relationships  . Social connections:    Talks on phone: Not on file    Gets together: Not on file    Attends religious service: Not on file     Active member of club or organization: Not on file    Attends meetings of clubs or organizations: Not on file    Relationship status: Not on file  . Intimate partner violence:    Fear of current or ex partner: Not on file    Emotionally abused: Not on file    Physically abused: Not on file    Forced sexual activity: Not on file  Other Topics Concern  . Not on file  Social History Narrative  . Not on file    Current Outpatient Medications on File Prior to Visit  Medication Sig Dispense Refill  . apixaban (ELIQUIS) 5 MG TABS tablet Take 1 tablet (5 mg total) by mouth 2 (two) times daily. 60 tablet 0  . Dextran 70-Hypromellose (ARTIFICIAL TEARS) 0.1-0.3 % SOLN Place 1 drop into both eyes every 4 (four) hours as needed (for dry eyes).     Marland Kitchen latanoprost (XALATAN) 0.005 % ophthalmic solution Place 1 drop into the right eye at bedtime.     Marland Kitchen lisinopril-hydrochlorothiazide (PRINZIDE,ZESTORETIC) 10-12.5 MG tablet Take 1 tablet by mouth daily.     No current facility-administered medications on file prior to visit.     Allergies  Allergen Reactions  . Amoxicillin Nausea Only    Has patient had a PCN reaction causing immediate rash, facial/tongue/throat swelling, SOB or lightheadedness with hypotension: No Has patient had a PCN reaction causing severe rash involving mucus membranes or skin necrosis: No Has patient had a PCN reaction that required hospitalization: No Has patient had a PCN reaction occurring within the last 10 years: Yes If all of the above answers are "NO", then may proceed with Cephalosporin use.       Observations/Objective: There were no vitals filed for this visit. There is no height or weight on file to calculate BMI.  Pain level 0 Physical Exam  Constitutional: He is oriented to person, place, and time and well-developed, well-nourished, and in no distress. No distress.  HENT:  Head: Normocephalic and atraumatic.  Pulmonary/Chest: Effort normal.  Neurological: He  is alert and oriented to person, place, and time.  Psychiatric: Affect normal.    CBC    Component Value Date/Time   WBC 3.9 (L) 10/27/2018 0919   RBC 5.11 10/27/2018 0919   HGB 15.5 10/27/2018 0919   HCT 45.8 10/27/2018 0919   PLT 167 10/27/2018 0919   MCV 89.6 10/27/2018 0919   MCH 30.3 10/27/2018 0919   MCHC 33.8 10/27/2018 0919   RDW 12.2 10/27/2018 0919   LYMPHSABS 1.4 10/27/2018 0919   MONOABS 0.3 10/27/2018 0919   EOSABS 0.1 10/27/2018 0919   BASOSABS 0.0 10/27/2018 0919    CMP     Component Value Date/Time   NA 138 10/27/2018 0919   K 3.7 10/27/2018 0919   CL 102 10/27/2018 0919   CO2 26 10/27/2018 0919   GLUCOSE 157 (H) 10/27/2018 0919  BUN 14 10/27/2018 0919   CREATININE 0.94 10/27/2018 0919   CALCIUM 9.4 10/27/2018 0919   PROT 7.7 10/27/2018 0919   ALBUMIN 4.4 10/27/2018 0919   AST 22 10/27/2018 0919   ALT 20 10/27/2018 0919   ALKPHOS 59 10/27/2018 0919   BILITOT 0.9 10/27/2018 0919   GFRNONAA >60 10/27/2018 0919   GFRAA >60 10/27/2018 0919    Negative factor V Leiden mutation and negative prothrombin gene mutation. Multiple myeloma panel negative.  No M spike. Assessment and Plan: 1. History of pulmonary embolism   2. Chronic anticoagulation   3. History of DVT (deep vein thrombosis)   Labs reviewed and discussed with patient.  Stable hemoglobin.  Clinically he tolerates anticoagulation well. Patient has finished 6 months of anticoagulation with Eliquis 5 mg twice daily. I recommend obtaining ultrasound of right lower extremity.  If negative study, will recommend patient to decrease to 2.5 mg twice daily.  Recommend long-term maintenance anticoagulation given her history of recurrent PE and DVT.  Follow Up Instructions: Lab MD reassessment in 3 months.   I discussed the assessment and treatment plan with the patient. The patient was provided an opportunity to ask questions and all were answered. The patient agreed with the plan and demonstrated an  understanding of the instructions.  The patient was advised to call back or seek an in-person evaluation if the symptoms worsen or if the condition fails to improve as anticipated.   Earlie Server, MD 10/29/2018 4:16 PM

## 2018-10-29 ENCOUNTER — Encounter: Payer: Self-pay | Admitting: Oncology

## 2018-11-04 ENCOUNTER — Ambulatory Visit: Payer: Medicare Other

## 2018-11-05 ENCOUNTER — Other Ambulatory Visit: Payer: Self-pay

## 2018-11-05 ENCOUNTER — Ambulatory Visit
Admission: RE | Admit: 2018-11-05 | Discharge: 2018-11-05 | Disposition: A | Discharge status: Home / Self Care | Payer: Medicare Other | Source: Ambulatory Visit | Attending: Oncology | Admitting: Oncology

## 2018-11-05 ENCOUNTER — Other Ambulatory Visit: Payer: Self-pay | Admitting: *Deleted

## 2018-11-05 ENCOUNTER — Other Ambulatory Visit: Payer: Self-pay | Admitting: Oncology

## 2018-11-05 DIAGNOSIS — Z86711 Personal history of pulmonary embolism: Secondary | ICD-10-CM

## 2018-11-05 DIAGNOSIS — Z86718 Personal history of other venous thrombosis and embolism: Secondary | ICD-10-CM

## 2018-11-05 DIAGNOSIS — Z7901 Long term (current) use of anticoagulants: Secondary | ICD-10-CM | POA: Diagnosis present

## 2018-11-05 MED ORDER — APIXABAN 2.5 MG PO TABS: 2.5000 mg | ORAL_TABLET | Freq: Two times a day (BID) | ORAL | 2 refills | Status: DC

## 2019-01-26 ENCOUNTER — Inpatient Hospital Stay: Payer: Medicare Other

## 2019-01-26 ENCOUNTER — Inpatient Hospital Stay: Payer: Medicare Other | Admitting: Oncology

## 2019-02-16 ENCOUNTER — Encounter: Payer: Self-pay | Admitting: Oncology

## 2019-02-16 ENCOUNTER — Other Ambulatory Visit: Payer: Self-pay

## 2019-02-16 ENCOUNTER — Inpatient Hospital Stay: Payer: Medicare Other | Attending: Oncology

## 2019-02-16 ENCOUNTER — Inpatient Hospital Stay (HOSPITAL_BASED_OUTPATIENT_CLINIC_OR_DEPARTMENT_OTHER): Payer: Medicare Other | Admitting: Oncology

## 2019-02-16 VITALS — BP 124/84 | HR 96 | Temp 96.9°F | Resp 18 | Wt 202.2 lb

## 2019-02-16 DIAGNOSIS — Z96653 Presence of artificial knee joint, bilateral: Secondary | ICD-10-CM | POA: Diagnosis not present

## 2019-02-16 DIAGNOSIS — Z87891 Personal history of nicotine dependence: Secondary | ICD-10-CM | POA: Diagnosis not present

## 2019-02-16 DIAGNOSIS — Z86718 Personal history of other venous thrombosis and embolism: Secondary | ICD-10-CM

## 2019-02-16 DIAGNOSIS — Z86711 Personal history of pulmonary embolism: Secondary | ICD-10-CM | POA: Diagnosis present

## 2019-02-16 DIAGNOSIS — Z79899 Other long term (current) drug therapy: Secondary | ICD-10-CM | POA: Diagnosis not present

## 2019-02-16 DIAGNOSIS — Z96611 Presence of right artificial shoulder joint: Secondary | ICD-10-CM | POA: Diagnosis not present

## 2019-02-16 DIAGNOSIS — I10 Essential (primary) hypertension: Secondary | ICD-10-CM | POA: Insufficient documentation

## 2019-02-16 DIAGNOSIS — Z7901 Long term (current) use of anticoagulants: Secondary | ICD-10-CM

## 2019-02-16 LAB — COMPREHENSIVE METABOLIC PANEL
ALT: 26 U/L (ref 0–44)
AST: 22 U/L (ref 15–41)
Albumin: 4.3 g/dL (ref 3.5–5.0)
Alkaline Phosphatase: 63 U/L (ref 38–126)
Anion gap: 8 (ref 5–15)
BUN: 14 mg/dL (ref 8–23)
CO2: 29 mmol/L (ref 22–32)
Calcium: 9.4 mg/dL (ref 8.9–10.3)
Chloride: 102 mmol/L (ref 98–111)
Creatinine, Ser: 0.86 mg/dL (ref 0.61–1.24)
GFR calc Af Amer: >60 mL/min (ref >60)
GFR calc non Af Amer: >60 mL/min (ref >60)
Glucose, Bld: 157 mg/dL — ABNORMAL HIGH (ref 70–99)
Potassium: 3.9 mmol/L (ref 3.5–5.1)
Sodium: 139 mmol/L (ref 135–145)
Total Bilirubin: 1.3 mg/dL — ABNORMAL HIGH (ref 0.3–1.2)
Total Protein: 8 g/dL (ref 6.5–8.1)

## 2019-02-16 LAB — CBC WITH DIFFERENTIAL/PLATELET
Abs Immature Granulocytes: 0.01 10*3/uL (ref 0.00–0.07)
Basophils Absolute: 0 10*3/uL (ref 0.0–0.1)
Basophils Relative: 1 %
Eosinophils Absolute: 0.1 10*3/uL (ref 0.0–0.5)
Eosinophils Relative: 1 %
HCT: 47.2 % (ref 39.0–52.0)
Hemoglobin: 15.9 g/dL (ref 13.0–17.0)
Immature Granulocytes: 0 %
Lymphocytes Relative: 41 %
Lymphs Abs: 1.8 10*3/uL (ref 0.7–4.0)
MCH: 30.2 pg (ref 26.0–34.0)
MCHC: 33.7 g/dL (ref 30.0–36.0)
MCV: 89.7 fL (ref 80.0–100.0)
Monocytes Absolute: 0.3 10*3/uL (ref 0.1–1.0)
Monocytes Relative: 6 %
Neutro Abs: 2.3 10*3/uL (ref 1.7–7.7)
Neutrophils Relative %: 51 %
Platelets: 193 10*3/uL (ref 150–400)
RBC: 5.26 MIL/uL (ref 4.22–5.81)
RDW: 12.4 % (ref 11.5–15.5)
WBC: 4.5 10*3/uL (ref 4.0–10.5)
nRBC: 0 % (ref 0.0–0.2)

## 2019-02-16 MED ORDER — APIXABAN 2.5 MG PO TABS: 2.5000 mg | ORAL_TABLET | Freq: Two times a day (BID) | ORAL | 1 refills | Status: DC

## 2019-02-16 NOTE — Progress Notes (Signed)
,  Patient does not offer any problems today.

## 2019-02-17 NOTE — Progress Notes (Signed)
Hematology/Oncology Follow Up Note [facility]  Telephone:([phone] [phone]  Patient Care Team: Eusebio Me, MD as PCP - General (Family Medicine)   Name of the patient: Tyrone Coffey  [id number]  [birth year]   REASON FOR VISIT Follow up for management of pulmonary embolism and DVT  PERTINENT HEM/ONC HISTORY 73 y.o. male with PMH listed at below who presents to ER for evaluation of pleuritic chest pain on the right side for the past few days.  He went to urgent care today in the send the patient to the hospital due to history of pulmonary embolism. CT angiogram was done which showed multiple pulmonary emboli in both lungs with right heart strain.  Heparin drip was started and patient is admitted for further evaluation and management. Lower extremity venous duplex also showed incomplete occlusive DVT in the distal right popliteal vein.   # History of recurrent PE.  He recently moved to [place] from [place].  Previous care at [facility].  Medical records reviewed. 11/13/2011 he developed extensive bilateral pulmonary emboli with evidence of right heart strain and possible lingular infarct.  At that time, he also had nonconclusive noncompressible thrombus in the left popliteal vein.  Patient reports that he Coumadin for couple of months and chemo. Per patient, this happened after hip revision surgery. Prior to his PE/DVT episodes in 2013, he also reports right upper extremity clots that developed after right shoulder history.  Reports that he took Coumadin for very short period of time.  Records not available to me at this point.  Patient denies any trauma, recent surgery, long distance car trip or air flight. #04/2018 anticoagulation with Eliquis 5 mg twice daily-switch to Eliquis 2.5 mg twice daily on 10/27/2018 for maintenance.  INTERVAL HISTORY 73 y.o. male with above hematology history reviewed by me presents for follow up of  recurrent Pulmonary embolism and DVT.  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 Patient tolerates anticoagulation.Denies hematochezia, hematuria, hematemesis, epistaxis, black tarry stool or easy bruising.  Denies any shortness of breath, chest pain, abdominal pain, lower extremity swelling. No new complaints today.  Review of Systems  Constitutional: Negative for chills, fever, malaise/fatigue and weight loss.  HENT: Negative for nosebleeds and sore throat.   Eyes: Negative for double vision, photophobia and redness.  Respiratory: Negative for cough, shortness of breath and wheezing.   Cardiovascular: Negative for chest pain, palpitations, orthopnea and leg swelling.  Gastrointestinal: Negative for abdominal pain, blood in stool, nausea and vomiting.  Genitourinary: Negative for dysuria.  Musculoskeletal: Negative for back pain, myalgias and neck pain.  Skin: Negative for itching and rash.  Neurological: Negative for dizziness, tingling and tremors.  Endo/Heme/Allergies: Negative for environmental allergies. Does not bruise/bleed easily.  Psychiatric/Behavioral: Negative for depression and hallucinations.      Allergies  Allergen Reactions  . Amoxicillin Nausea Only    Has patient had a PCN reaction causing immediate rash, facial/tongue/throat swelling, SOB or lightheadedness with hypotension: No Has patient had a PCN reaction causing severe rash involving mucus membranes or skin necrosis: No Has patient had a PCN reaction that required hospitalization: No Has patient had a PCN reaction occurring within the last 10 years: Yes If all of the above answers are "NO", then may proceed with Cephalosporin use.     Past Medical History:  Diagnosis Date  . Arthritis   . Hypertension   . Pulmonary embolism ([place])  Past Surgical History:  Procedure Laterality Date  . c2 - 3 vertebrae fuse    . JOINT REPLACEMENT     . left hip replacement     . REPLACEMENT TOTAL KNEE BILATERAL    . right shoulder replacement      Social History   Socioeconomic History  . Marital status: Married    Spouse name: [name]  . Number of children: Not on file  . Years of education: Not on file  . Highest education level: Not on file  Occupational History  . Occupation: retired  Scientific laboratory technician  . Financial resource strain: Not on file  . Food insecurity    Worry: Not on file    Inability: Not on file  . Transportation needs    Medical: Not on file    Non-medical: Not on file  Tobacco Use  . Smoking status: Former Smoker    Quit date: 05/05/1978    Years since quitting: 40.8  . Smokeless tobacco: Never Used  . Tobacco comment: smoked 1 pack/week for about 10 years   Substance and Sexual Activity  . Alcohol use: Yes    Comment: occasionally  . Drug use: Never  . Sexual activity: Not on file  Lifestyle  . Physical activity    Days per week: Not on file    Minutes per session: Not on file  . Stress: Not on file  Relationships  . Social Herbalist on phone: Not on file    Gets together: Not on file    Attends religious service: Not on file    Active member of club or organization: Not on file    Attends meetings of clubs or organizations: Not on file    Relationship status: Not on file  . Intimate partner violence    Fear of current or ex partner: Not on file    Emotionally abused: Not on file    Physically abused: Not on file    Forced sexual activity: Not on file  Other Topics Concern  . Not on file  Social History Narrative  . Not on file    Family History  Problem Relation Age of Onset  . Arthritis Mother   . Heart Problems Mother   . Heart Problems Father   . Lung cancer Brother   . Diabetes Brother   . Kidney disease Sister      Current Outpatient Medications:  .  apixaban (ELIQUIS) 2.5 MG TABS tablet, Take 1 tablet (2.5 mg total) by mouth 2 (two) times daily., Disp: 180  tablet, Rfl: 1 .  Dextran 70-Hypromellose (ARTIFICIAL TEARS) 0.1-0.3 % SOLN, Place 1 drop into both eyes every 4 (four) hours as needed (for dry eyes). , Disp: , Rfl:  .  latanoprost (XALATAN) 0.005 % ophthalmic solution, Place 1 drop into the right eye at bedtime. , Disp: , Rfl:  .  lisinopril-hydrochlorothiazide (PRINZIDE,ZESTORETIC) 10-12.5 MG tablet, Take 1 tablet by mouth daily., Disp: , Rfl:   Physical exam:  Vitals:   02/16/19 1409  BP: 124/84  Pulse: 96  Resp: 18  Temp: (!) 96.9 F (36.1 C)  Weight: 202 lb 3.2 oz (91.7 kg)   Physical Exam Constitutional:      General: He is not in acute distress. HENT:     Head: Normocephalic and atraumatic.  Eyes:     General: No scleral icterus.    Pupils: Pupils are equal, round, and reactive to light.  Neck:     Musculoskeletal:  Normal range of motion and neck supple.  Cardiovascular:     Rate and Rhythm: Normal rate and regular rhythm.     Heart sounds: Normal heart sounds.  Pulmonary:     Effort: Pulmonary effort is normal. No respiratory distress.     Breath sounds: No wheezing.  Abdominal:     General: Bowel sounds are normal. There is no distension.     Palpations: Abdomen is soft. There is no mass.     Tenderness: There is no abdominal tenderness.  Musculoskeletal: Normal range of motion.        General: No deformity.  Skin:    General: Skin is warm and dry.     Findings: No erythema or rash.  Neurological:     Mental Status: He is alert and oriented to person, place, and time.     Cranial Nerves: No cranial nerve deficit.     Coordination: Coordination normal.  Psychiatric:        Behavior: Behavior normal.        Thought Content: Thought content normal.     CMP Latest Ref Rng & Units 02/16/2019  Glucose 70 - 99 mg/dL 157(H)  BUN 8 - 23 mg/dL 14  Creatinine 0.61 - 1.24 mg/dL 0.86  Sodium 135 - 145 mmol/L 139  Potassium 3.5 - 5.1 mmol/L 3.9  Chloride 98 - 111 mmol/L 102  CO2 22 - 32 mmol/L 29  Calcium 8.9 -  10.3 mg/dL 9.4  Total Protein 6.5 - 8.1 g/dL 8.0  Total Bilirubin 0.3 - 1.2 mg/dL 1.3(H)  Alkaline Phos 38 - 126 U/L 63  AST 15 - 41 U/L 22  ALT 0 - 44 U/L 26   CBC Latest Ref Rng & Units 02/16/2019  WBC 4.0 - 10.5 K/uL 4.5  Hemoglobin 13.0 - 17.0 g/dL 15.9  Hematocrit 39.0 - 52.0 % 47.2  Platelets 150 - 400 K/uL 193   Negative factor V Leiden mutation and prothrombin mutation.   Assessment and plan 1. History of pulmonary embolism   2. Chronic anticoagulation   3. History of DVT (deep vein thrombosis)    Labs are reviewed and discussed with patient. He clinically doing well on Eliquis 2.5 mg twice daily. No clinical signs of thrombosis. Recommend continue long-term Eliquis 2.5 mg twice daily.  #Tolerating chronic anticoagulation with Eliquis 5 mg twice daily. Recommend to continue anticoagulation to complete total of 6 months of anticoagulation. Patient will be reevaluated at that point and will consider decrease to 2.5 mg twice daily. Will check his factor V Leiden and prothrombin mutation as part of hypercoagulation work-up.  #High serum protein level, resolved.  Multiple myeloma panel showed no M protein spikes..   Orders Placed This Encounter  Procedures  . CBC with Differential/Platelet    Standing Status:   Future    Standing Expiration Date:   02/16/2020  . Comprehensive metabolic panel    Standing Status:   Future    Standing Expiration Date:   02/16/2020      Follow up in 6 months.  Earlie Server, MD, PhD Hematology Oncology [facility] Pager- [phone] 02/17/2019

## 2019-07-26 ENCOUNTER — Other Ambulatory Visit: Payer: Self-pay | Admitting: *Deleted

## 2019-07-27 MED ORDER — APIXABAN 2.5 MG PO TABS: 2.5000 mg | ORAL_TABLET | Freq: Two times a day (BID) | ORAL | 1 refills | Status: DC

## 2019-08-17 ENCOUNTER — Other Ambulatory Visit: Payer: Self-pay

## 2019-08-17 ENCOUNTER — Inpatient Hospital Stay (HOSPITAL_BASED_OUTPATIENT_CLINIC_OR_DEPARTMENT_OTHER): Payer: Medicare PPO | Admitting: Oncology

## 2019-08-17 ENCOUNTER — Inpatient Hospital Stay: Payer: Medicare PPO | Attending: Oncology

## 2019-08-17 ENCOUNTER — Encounter: Payer: Self-pay | Admitting: Oncology

## 2019-08-17 VITALS — BP 104/73 | HR 80 | Temp 94.4°F | Resp 16 | Wt 207.9 lb

## 2019-08-17 DIAGNOSIS — Z86711 Personal history of pulmonary embolism: Secondary | ICD-10-CM | POA: Diagnosis not present

## 2019-08-17 DIAGNOSIS — Z8261 Family history of arthritis: Secondary | ICD-10-CM | POA: Diagnosis not present

## 2019-08-17 DIAGNOSIS — Z801 Family history of malignant neoplasm of trachea, bronchus and lung: Secondary | ICD-10-CM | POA: Diagnosis not present

## 2019-08-17 DIAGNOSIS — Z8249 Family history of ischemic heart disease and other diseases of the circulatory system: Secondary | ICD-10-CM | POA: Insufficient documentation

## 2019-08-17 DIAGNOSIS — Z833 Family history of diabetes mellitus: Secondary | ICD-10-CM | POA: Insufficient documentation

## 2019-08-17 DIAGNOSIS — Z87891 Personal history of nicotine dependence: Secondary | ICD-10-CM | POA: Insufficient documentation

## 2019-08-17 DIAGNOSIS — Z86718 Personal history of other venous thrombosis and embolism: Secondary | ICD-10-CM

## 2019-08-17 DIAGNOSIS — Z96653 Presence of artificial knee joint, bilateral: Secondary | ICD-10-CM | POA: Insufficient documentation

## 2019-08-17 DIAGNOSIS — I10 Essential (primary) hypertension: Secondary | ICD-10-CM | POA: Insufficient documentation

## 2019-08-17 DIAGNOSIS — Z96611 Presence of right artificial shoulder joint: Secondary | ICD-10-CM | POA: Insufficient documentation

## 2019-08-17 DIAGNOSIS — Z7901 Long term (current) use of anticoagulants: Secondary | ICD-10-CM | POA: Insufficient documentation

## 2019-08-17 DIAGNOSIS — Z79899 Other long term (current) drug therapy: Secondary | ICD-10-CM | POA: Diagnosis not present

## 2019-08-17 LAB — COMPREHENSIVE METABOLIC PANEL
ALT: 36 U/L (ref 0–44)
AST: 24 U/L (ref 15–41)
Albumin: 4.5 g/dL (ref 3.5–5.0)
Alkaline Phosphatase: 61 U/L (ref 38–126)
Anion gap: 11 (ref 5–15)
BUN: 15 mg/dL (ref 8–23)
CO2: 29 mmol/L (ref 22–32)
Calcium: 9.7 mg/dL (ref 8.9–10.3)
Chloride: 98 mmol/L (ref 98–111)
Creatinine, Ser: 0.82 mg/dL (ref 0.61–1.24)
GFR calc Af Amer: >60 mL/min (ref >60)
GFR calc non Af Amer: >60 mL/min (ref >60)
Glucose, Bld: 106 mg/dL — ABNORMAL HIGH (ref 70–99)
Potassium: 3.8 mmol/L (ref 3.5–5.1)
Sodium: 138 mmol/L (ref 135–145)
Total Bilirubin: 0.7 mg/dL (ref 0.3–1.2)
Total Protein: 8 g/dL (ref 6.5–8.1)

## 2019-08-17 LAB — CBC WITH DIFFERENTIAL/PLATELET
Abs Immature Granulocytes: 0.02 10*3/uL (ref 0.00–0.07)
Basophils Absolute: 0.1 10*3/uL (ref 0.0–0.1)
Basophils Relative: 1 %
Eosinophils Absolute: 0.1 10*3/uL (ref 0.0–0.5)
Eosinophils Relative: 2 %
HCT: 50 % (ref 39.0–52.0)
Hemoglobin: 16.4 g/dL (ref 13.0–17.0)
Immature Granulocytes: 1 %
Lymphocytes Relative: 45 %
Lymphs Abs: 1.9 10*3/uL (ref 0.7–4.0)
MCH: 29.9 pg (ref 26.0–34.0)
MCHC: 32.8 g/dL (ref 30.0–36.0)
MCV: 91.2 fL (ref 80.0–100.0)
Monocytes Absolute: 0.5 10*3/uL (ref 0.1–1.0)
Monocytes Relative: 13 %
Neutro Abs: 1.6 10*3/uL — ABNORMAL LOW (ref 1.7–7.7)
Neutrophils Relative %: 38 %
Platelets: 176 10*3/uL (ref 150–400)
RBC: 5.48 MIL/uL (ref 4.22–5.81)
RDW: 12 % (ref 11.5–15.5)
WBC: 4.1 10*3/uL (ref 4.0–10.5)
nRBC: 0 % (ref 0.0–0.2)

## 2019-08-17 NOTE — Progress Notes (Signed)
Patient does not offer any problems today.  

## 2019-08-17 NOTE — Progress Notes (Signed)
Hematology/Oncology Follow Up Note [facility]  Telephone:([phone] [phone]  Patient Care Team: Tammi Klippel, MD as PCP - General (Family Medicine)   Name of the patient: Tyrone Coffey  [id number]  [birth year]   REASON FOR VISIT Follow up for management of pulmonary embolism and DVT  PERTINENT HEM/ONC HISTORY 74 y.o. male with PMH listed at below who presents to ER for evaluation of pleuritic chest pain on the right side for the past few days.  He went to urgent care today in the send the patient to the hospital due to history of pulmonary embolism. CT angiogram was done which showed multiple pulmonary emboli in both lungs with right heart strain.  Heparin drip was started and patient is admitted for further evaluation and management. Lower extremity venous duplex also showed incomplete occlusive DVT in the distal right popliteal vein.   # History of recurrent PE.  He recently moved to [place] from [place].  Previous care at [facility].  Medical records reviewed. 11/13/2011 he developed extensive bilateral pulmonary emboli with evidence of right heart strain and possible lingular infarct.  At that time, he also had nonconclusive noncompressible thrombus in the left popliteal vein.  Patient reports that he Coumadin for couple of months and chemo. Per patient, this happened after hip revision surgery. Prior to his PE/DVT episodes in 2013, he also reports right upper extremity clots that developed after right shoulder history.  Reports that he took Coumadin for very short period of time.  Records not available to me at this point.  Patient denies any trauma, recent surgery, long distance car trip or air flight. #04/2018 anticoagulation with Eliquis 5 mg twice daily-switch to Eliquis 2.5 mg twice daily on 10/27/2018 for maintenance.  INTERVAL HISTORY 74 y.o. male with above hematology history reviewed by me presents for follow up of  recurrent Pulmonary embolism and DVT.  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 He tolerates anticoagulation.  No bleeding events.   Denies any shortness of breath, chest pain, abdominal pain, lower extremity swelling. No new complaints today.  Review of Systems  Constitutional: Negative for chills, fever, malaise/fatigue and weight loss.  HENT: Negative for nosebleeds and sore throat.   Eyes: Negative for double vision, photophobia and redness.  Respiratory: Negative for cough, shortness of breath and wheezing.   Cardiovascular: Negative for chest pain, palpitations, orthopnea and leg swelling.  Gastrointestinal: Negative for abdominal pain, blood in stool, nausea and vomiting.  Genitourinary: Negative for dysuria.  Musculoskeletal: Negative for back pain, myalgias and neck pain.  Skin: Negative for itching and rash.  Neurological: Negative for dizziness, tingling and tremors.  Endo/Heme/Allergies: Negative for environmental allergies. Does not bruise/bleed easily.  Psychiatric/Behavioral: Negative for depression and hallucinations.      Allergies  Allergen Reactions  . Amoxicillin Nausea Only    Has patient had a PCN reaction causing immediate rash, facial/tongue/throat swelling, SOB or lightheadedness with hypotension: No Has patient had a PCN reaction causing severe rash involving mucus membranes or skin necrosis: No Has patient had a PCN reaction that required hospitalization: No Has patient had a PCN reaction occurring within the last 10 years: Yes If all of the above answers are "NO", then may proceed with Cephalosporin use.     Past Medical History:  Diagnosis Date  . Arthritis   . Hypertension   . Pulmonary embolism [facility])      Past Surgical  History:  Procedure Laterality Date  . c2 - 3 vertebrae fuse    . JOINT REPLACEMENT    . left hip replacement     . REPLACEMENT TOTAL KNEE BILATERAL    .  right shoulder replacement      Social History   Socioeconomic History  . Marital status: Married    Spouse name: Tyrone Coffey  . Number of children: Not on file  . Years of education: Not on file  . Highest education level: Not on file  Occupational History  . Occupation: retired  Tobacco Use  . Smoking status: Former Smoker    Quit date: 05/05/1978    Years since quitting: 41.3  . Smokeless tobacco: Never Used  . Tobacco comment: smoked 1 pack/week for about 10 years   Substance and Sexual Activity  . Alcohol use: Yes    Comment: occasionally  . Drug use: Never  . Sexual activity: Not on file  Other Topics Concern  . Not on file  Social History Narrative  . Not on file   Social Determinants of Health   Financial Resource Strain:   . Difficulty of Paying Living Expenses: Not on file  Food Insecurity:   . Worried About Charity fundraiser in the Last Year: Not on file  . Ran Out of Food in the Last Year: Not on file  Transportation Needs:   . Lack of Transportation (Medical): Not on file  . Lack of Transportation (Non-Medical): Not on file  Physical Activity:   . Days of Exercise per Week: Not on file  . Minutes of Exercise per Session: Not on file  Stress:   . Feeling of Stress : Not on file  Social Connections:   . Frequency of Communication with Friends and Family: Not on file  . Frequency of Social Gatherings with Friends and Family: Not on file  . Attends Religious Services: Not on file  . Active Member of Clubs or Organizations: Not on file  . Attends Archivist Meetings: Not on file  . Marital Status: Not on file  Intimate Partner Violence:   . Fear of Current or Ex-Partner: Not on file  . Emotionally Abused: Not on file  . Physically Abused: Not on file  . Sexually Abused: Not on file    Family History  Problem Relation Age of Onset  . Arthritis Mother   . Heart Problems Mother   . Heart Problems Father   . Lung cancer Brother   . Diabetes  Brother   . Kidney disease Sister      Current Outpatient Medications:  .  apixaban (ELIQUIS) 2.5 MG TABS tablet, Take 1 tablet (2.5 mg total) by mouth 2 (two) times daily., Disp: 180 tablet, Rfl: 1 .  Dextran 70-Hypromellose (ARTIFICIAL TEARS) 0.1-0.3 % SOLN, Place 1 drop into both eyes every 4 (four) hours as needed (for dry eyes). , Disp: , Rfl:  .  latanoprost (XALATAN) 0.005 % ophthalmic solution, Place 1 drop into the right eye at bedtime. , Disp: , Rfl:  .  lisinopril-hydrochlorothiazide (PRINZIDE,ZESTORETIC) 10-12.5 MG tablet, Take 1 tablet by mouth daily., Disp: , Rfl:   Physical exam:  Vitals:   08/17/19 0946  BP: 104/73  Pulse: 80  Resp: 16  Temp: (!) 94.4 F (34.7 C)  Weight: 207 lb 14.4 oz (94.3 kg)   Physical Exam Constitutional:      General: He is not in acute distress. HENT:     Head: Normocephalic and atraumatic.  Eyes:     General: No scleral icterus.    Pupils: Pupils are equal, round, and reactive to light.  Cardiovascular:     Rate and Rhythm: Normal rate and regular rhythm.     Heart sounds: Normal heart sounds.  Pulmonary:     Effort: Pulmonary effort is normal. No respiratory distress.     Breath sounds: No wheezing.  Abdominal:     General: Bowel sounds are normal.     Palpations: Abdomen is soft.  Musculoskeletal:        General: No deformity. Normal range of motion.     Cervical back: Normal range of motion and neck supple.  Skin:    General: Skin is warm and dry.     Findings: No erythema or rash.  Neurological:     Mental Status: He is alert and oriented to person, place, and time. Mental status is at baseline.     Cranial Nerves: No cranial nerve deficit.     Coordination: Coordination normal.  Psychiatric:        Mood and Affect: Mood normal.     CMP Latest Ref Rng & Units 08/17/2019  Glucose 70 - 99 mg/dL 161(W)  BUN 8 - 23 mg/dL 15  Creatinine 9.60 - 4.54 mg/dL 0.98  Sodium 119 - 147 mmol/L 138  Potassium 3.5 - 5.1 mmol/L 3.8    Chloride 98 - 111 mmol/L 98  CO2 22 - 32 mmol/L 29  Calcium 8.9 - 10.3 mg/dL 9.7  Total Protein 6.5 - 8.1 g/dL 8.0  Total Bilirubin 0.3 - 1.2 mg/dL 0.7  Alkaline Phos 38 - 126 U/L 61  AST 15 - 41 U/L 24  ALT 0 - 44 U/L 36   CBC Latest Ref Rng & Units 08/17/2019  WBC 4.0 - 10.5 K/uL 4.1  Hemoglobin 13.0 - 17.0 g/dL 82.9  Hematocrit 56.2 - 52.0 % 50.0  Platelets 150 - 400 K/uL 176   Negative factor V Leiden mutation and prothrombin mutation.   Assessment and plan 1. History of pulmonary embolism   2. History of DVT (deep vein thrombosis)    # Labs are reviewed and discussed with patient. He is doing well clinically.  Continue Eliquis 2.5mg  BID as maintenance.  Refills were sent to pharmacy.    Orders Placed This Encounter  Procedures  . CBC with Differential/Platelet    Standing Status:   Future    Standing Expiration Date:   02/13/2021  . Comprehensive metabolic panel    Standing Status:   Future    Standing Expiration Date:   02/13/2021      Follow up in 6 months.  Rickard Patience, MD, PhD Hematology Oncology [facility] Pager- [phone] 08/17/2019

## 2019-10-31 ENCOUNTER — Other Ambulatory Visit: Payer: Self-pay

## 2019-10-31 ENCOUNTER — Emergency Department
Admission: EM | Admit: 2019-10-31 | Discharge: 2019-10-31 | Disposition: A | Discharge status: Home / Self Care | Payer: Medicare PPO | Attending: Emergency Medicine | Admitting: Emergency Medicine

## 2019-10-31 ENCOUNTER — Emergency Department: Payer: Medicare PPO

## 2019-10-31 ENCOUNTER — Encounter: Payer: Self-pay | Admitting: Emergency Medicine

## 2019-10-31 DIAGNOSIS — M25511 Pain in right shoulder: Secondary | ICD-10-CM | POA: Insufficient documentation

## 2019-10-31 DIAGNOSIS — R0789 Other chest pain: Secondary | ICD-10-CM | POA: Diagnosis present

## 2019-10-31 DIAGNOSIS — R079 Chest pain, unspecified: Secondary | ICD-10-CM

## 2019-10-31 DIAGNOSIS — Z96642 Presence of left artificial hip joint: Secondary | ICD-10-CM | POA: Insufficient documentation

## 2019-10-31 DIAGNOSIS — I10 Essential (primary) hypertension: Secondary | ICD-10-CM | POA: Diagnosis not present

## 2019-10-31 DIAGNOSIS — M549 Dorsalgia, unspecified: Secondary | ICD-10-CM | POA: Insufficient documentation

## 2019-10-31 DIAGNOSIS — Z79899 Other long term (current) drug therapy: Secondary | ICD-10-CM | POA: Diagnosis not present

## 2019-10-31 DIAGNOSIS — Z87891 Personal history of nicotine dependence: Secondary | ICD-10-CM | POA: Diagnosis not present

## 2019-10-31 LAB — BASIC METABOLIC PANEL
Anion gap: 8 (ref 5–15)
BUN: 12 mg/dL (ref 8–23)
CO2: 26 mmol/L (ref 22–32)
Calcium: 9.3 mg/dL (ref 8.9–10.3)
Chloride: 106 mmol/L (ref 98–111)
Creatinine, Ser: 0.56 mg/dL — ABNORMAL LOW (ref 0.61–1.24)
GFR calc Af Amer: >60 mL/min (ref >60)
GFR calc non Af Amer: >60 mL/min (ref >60)
Glucose, Bld: 102 mg/dL — ABNORMAL HIGH (ref 70–99)
Potassium: 3.8 mmol/L (ref 3.5–5.1)
Sodium: 140 mmol/L (ref 135–145)

## 2019-10-31 LAB — CBC WITH DIFFERENTIAL/PLATELET
Abs Immature Granulocytes: 0.03 10*3/uL (ref 0.00–0.07)
Basophils Absolute: 0 10*3/uL (ref 0.0–0.1)
Basophils Relative: 1 %
Eosinophils Absolute: 0.1 10*3/uL (ref 0.0–0.5)
Eosinophils Relative: 2 %
HCT: 46.1 % (ref 39.0–52.0)
Hemoglobin: 15.6 g/dL (ref 13.0–17.0)
Immature Granulocytes: 1 %
Lymphocytes Relative: 30 %
Lymphs Abs: 1.5 10*3/uL (ref 0.7–4.0)
MCH: 30.5 pg (ref 26.0–34.0)
MCHC: 33.8 g/dL (ref 30.0–36.0)
MCV: 90.2 fL (ref 80.0–100.0)
Monocytes Absolute: 0.4 10*3/uL (ref 0.1–1.0)
Monocytes Relative: 9 %
Neutro Abs: 2.9 10*3/uL (ref 1.7–7.7)
Neutrophils Relative %: 57 %
Platelets: 167 10*3/uL (ref 150–400)
RBC: 5.11 MIL/uL (ref 4.22–5.81)
RDW: 12.3 % (ref 11.5–15.5)
WBC: 5 10*3/uL (ref 4.0–10.5)
nRBC: 0 % (ref 0.0–0.2)

## 2019-10-31 LAB — FIBRIN DERIVATIVES D-DIMER (ARMC ONLY): Fibrin derivatives D-dimer (ARMC): 675.84 ng/mL (FEU) — ABNORMAL HIGH (ref 0.00–499.00)

## 2019-10-31 LAB — TROPONIN I (HIGH SENSITIVITY)
Troponin I (High Sensitivity): 4 ng/L (ref <18)
Troponin I (High Sensitivity): 4 ng/L (ref <18)

## 2019-10-31 LAB — PROTIME-INR
INR: 1.1 (ref 0.8–1.2)
Prothrombin Time: 13.5 seconds (ref 11.4–15.2)

## 2019-10-31 MED ORDER — IOHEXOL 350 MG/ML SOLN
75.0000 mL | Freq: Once | INTRAVENOUS | Status: AC | PRN
  Administered 2019-10-31: 75 mL via INTRAVENOUS
  Filled 2019-10-31: qty 75

## 2019-10-31 MED ORDER — LIDOCAINE 5 % EX PTCH: 1.0000 | MEDICATED_PATCH | CUTANEOUS | 0 refills | Status: AC

## 2019-10-31 NOTE — ED Notes (Signed)
Pt states having pain in his lower back that started yesterday. Pt states he didn't bend over, fall, or do anything to hurt his back but that he's had PE's in the past and wants to rule that out.   Pt denies CP, shob, nvd, or other symptoms. Pt ambulated to room with slight difficulty, but needed no assistance by this RN.

## 2019-10-31 NOTE — ED Provider Notes (Signed)
[facility] Emergency Department Provider Note ____________________________________________  Time seen: 22  I have reviewed the triage vital signs and the nursing notes.  HISTORY  Chief Complaint  Back Pain  HPI Tyrone Coffey is a 74 y.o. 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 He describes intermittent sharp pains to the right of the midline, that seems to be moving laterally. Pain is aggravated by deep breaths and movement. He is not able to elicit pain with palpation. He denies any cough, congestion, hemoptysis, leg swelling, or dysuria. He recalls that his last PE developed from back pain in similar presentation.   Past Medical History:  Diagnosis Date  . Arthritis   . Hypertension   . Pulmonary embolism [facility])     Patient Active Problem List   Diagnosis Date Noted  . Acute deep vein thrombosis (DVT) of popliteal vein of right lower extremity (HCC)   . Pulmonary embolism (HCC) 04/22/2018  . Chest pain     Past Surgical History:  Procedure Laterality Date  . c2 - 3 vertebrae fuse    . JOINT REPLACEMENT    . left hip replacement     . REPLACEMENT TOTAL KNEE BILATERAL    . right shoulder replacement      Prior to Admission medications   Medication Sig Start Date End Date Taking? Authorizing Provider  apixaban (ELIQUIS) 2.5 MG TABS tablet Take 1 tablet (2.5 mg total) by mouth 2 (two) times daily. 07/27/19   Rickard Patience, MD  Dextran 70-Hypromellose (ARTIFICIAL TEARS) 0.1-0.3 % SOLN Place 1 drop into both eyes every 4 (four) hours as needed (for dry eyes).     [provider]  latanoprost (XALATAN) 0.005 % ophthalmic solution Place 1 drop into the right eye at bedtime.     [provider]  lidocaine (LIDODERM) 5 % Place 1 patch onto the skin daily for 5 days. Remove & Discard patch after 12 hours of wear each day. 10/31/19 11/05/19  Sumaya Riedesel, Charlesetta Ivory, PA-C   lisinopril-hydrochlorothiazide (PRINZIDE,ZESTORETIC) 10-12.5 MG tablet Take 1 tablet by mouth daily. 05/19/18   [provider]    Allergies Amoxicillin  Family History  Problem Relation Age of Onset  . Arthritis Mother   . Heart Problems Mother   . Heart Problems Father   . Lung cancer Brother   . Diabetes Brother   . Kidney disease Sister     Social History Social History   Tobacco Use  . Smoking status: Former Smoker    Quit date: 05/05/1978    Years since quitting: 41.5  . Smokeless tobacco: Never Used  . Tobacco comment: smoked 1 pack/week for about 10 years   Substance Use Topics  . Alcohol use: Yes    Comment: occasionally  . Drug use: Never    Review of Systems  Constitutional: Negative for fever. Eyes: Negative for visual changes. ENT: Negative for sore throat. Cardiovascular: Positive for chest pain. Respiratory: Negative for shortness of breath, hemoptysis, or cough Gastrointestinal: Negative for abdominal pain, vomiting and diarrhea. Genitourinary: Negative for dysuria. Musculoskeletal: Negative for back pain. Skin: Negative for rash. Neurological: Negative for headaches, focal weakness or numbness. ____________________________________________  PHYSICAL EXAM:  VITAL SIGNS: ED Triage Vitals  Enc Vitals Group     BP 10/31/19 0923 (!) 141/97     Pulse Rate 10/31/19 0923 96     Resp 10/31/19 0923 18  Temp 10/31/19 0923 98.1 F (36.7 C)     Temp src --      SpO2 10/31/19 0923 99 %     Weight 10/31/19 0926 200 lb (90.7 kg)     Height 10/31/19 0926 5\' 11"  (1.803 m)     Head Circumference --      Peak Flow --      Pain Score 10/31/19 0924 1     Pain Loc --      Pain Edu? --      Excl. in [place]? --     Constitutional: Alert and oriented. Well appearing and in no distress. Head: Normocephalic and atraumatic. Eyes: Conjunctivae are normal. Normal extraocular movements Cardiovascular: Normal rate, regular rhythm. Normal distal  pulses. Respiratory: Normal respiratory effort. No wheezes/rales/rhonchi. Localizes pain the to posteriolateral chest wall Gastrointestinal: Soft and nontender. No distention. Musculoskeletal: Nontender with normal range of motion in all extremities.  Neurologic:  Normal gait without ataxia. Normal speech and language. No gross focal neurologic deficits are appreciated. Skin:  Skin is warm, dry and intact. No rash noted. Psychiatric: Mood and affect are normal. Patient exhibits appropriate insight and judgment. ____________________________________________   LABS (pertinent positives/negatives)  Labs Reviewed  BASIC METABOLIC PANEL - Abnormal; Notable for the following components:      Result Value   Glucose, Bld 102 (*)    Creatinine, Ser 0.56 (*)    All other components within normal limits  FIBRIN DERIVATIVES D-DIMER ([facility] ONLY) - Abnormal; Notable for the following components:   Fibrin derivatives D-dimer ([facility]) 675.84 (*)    All other components within normal limits  CBC WITH DIFFERENTIAL/PLATELET  PROTIME-INR  URINALYSIS, COMPLETE (UACMP) WITH MICROSCOPIC  TROPONIN I (HIGH SENSITIVITY)  TROPONIN I (HIGH SENSITIVITY)  ____________________________________________  EKG  Sinus Brady 56 bpm Sinus arrhythmia Left Axis Deviation  Incomplete RBBB ____________________________________________   RADIOLOGY  CXR IMPRESSION: Lungs clear.  Cardiac silhouette within normal limits.  CTA PE w-w/o CM  IMPRESSION: 1. No evidence of pulmonary embolus or other acute abnormalities. 2. Small hiatal hernia.  ____________________________________________  PROCEDURES  Procedures ____________________________________________  INITIAL IMPRESSION / ASSESSMENT AND PLAN / ED COURSE  ----------------------------------------- 11:17 AM on 10/31/2019 ----------------------------------------- Patient with pleuritic chest pain who presents for evaluation with history of PE. His d-dimer is  elevated. CXR is reassuring, but CT Angio will be ordered. Patient is stable at this time.   DDX: PNA, PE, PTX, MSK, AMI  Patient with ED evaluation of right-sided chest and back pain, concerning for PE. He has had previous PE/DVT history and is currently on Eliquis.  His exam is benign reassuring at this time for the patient's been stable during his course in the ED.  His Wells score is 4.5 given his previous history and other diagnoses being less likely.  His D-dimer was elevated at 675.  Thankfully, the patient's chest x-ray and CTA are negative for any acute intrathoracic process including PE.  Patient is reassured at this time.  He will be discharged to follow-up with primary provider for ongoing symptoms.  He will continue his Eliquis as prescribed.  Return precautions are again reviewed.  Patient be discharged with Lidoderm patches today to treat his symptoms as they may represent a musculoskeletal injury.  Tyrone Coffey was evaluated in Emergency Department on 10/31/2019 for the symptoms described in the history of present illness. He was evaluated in the context of the global COVID-19 pandemic, which necessitated consideration that the patient might be at risk for infection with  the SARS-CoV-2 virus that causes COVID-19. Institutional protocols and algorithms that pertain to the evaluation of patients at risk for COVID-19 are in a state of rapid change based on information released by regulatory bodies including the CDC and federal and state organizations. These policies and algorithms were followed during the patient's care in the ED. ____________________________________________  FINAL CLINICAL IMPRESSION(S) / ED DIAGNOSES  Final diagnoses:  Chest pain in adult      Lissa Hoard, PA-C 10/31/19 1323    Dionne Bucy, MD 10/31/19 1600

## 2019-10-31 NOTE — ED Triage Notes (Addendum)
Pt to ER states low back pain, denies known injury.  States he has hx of blood clots with PE and that pain has classically started in his back.  Pt states pain started while sitting yesterday.  Pt denies SHOB, CP or leg pain.  Pt denies neurological symptoms.  Pt states pain is minimal while sitting still. States pain increases with movement and deep inspiration.  States pain increases with walking.

## 2019-10-31 NOTE — ED Notes (Signed)
Pt transported to CT ?

## 2019-10-31 NOTE — Discharge Instructions (Signed)
Your exam, labs, EKG, chest ray, and CT angio are all negative for any acute findings at this time for this medication of a pulmonary embolism in the lungs.  Patient continue to monitor and treat symptoms with Tylenol, Motrin, and Lidoderm patches.  Patient follow with primary provider return to the ED immediately for worsening symptoms.

## 2020-02-14 ENCOUNTER — Inpatient Hospital Stay: Payer: Medicare PPO | Attending: Oncology

## 2020-02-14 ENCOUNTER — Other Ambulatory Visit: Payer: Self-pay

## 2020-02-14 ENCOUNTER — Encounter: Payer: Self-pay | Admitting: Oncology

## 2020-02-14 ENCOUNTER — Inpatient Hospital Stay: Payer: Medicare PPO | Admitting: Oncology

## 2020-02-14 VITALS — BP 128/86 | HR 59 | Temp 97.6°F | Resp 20 | Wt 205.3 lb

## 2020-02-14 DIAGNOSIS — Z7901 Long term (current) use of anticoagulants: Secondary | ICD-10-CM | POA: Insufficient documentation

## 2020-02-14 DIAGNOSIS — Z86711 Personal history of pulmonary embolism: Secondary | ICD-10-CM

## 2020-02-14 DIAGNOSIS — Z87891 Personal history of nicotine dependence: Secondary | ICD-10-CM | POA: Diagnosis not present

## 2020-02-14 DIAGNOSIS — M151 Heberden's nodes (with arthropathy): Secondary | ICD-10-CM | POA: Diagnosis not present

## 2020-02-14 DIAGNOSIS — I10 Essential (primary) hypertension: Secondary | ICD-10-CM | POA: Diagnosis not present

## 2020-02-14 DIAGNOSIS — Z86718 Personal history of other venous thrombosis and embolism: Secondary | ICD-10-CM | POA: Insufficient documentation

## 2020-02-14 LAB — COMPREHENSIVE METABOLIC PANEL
ALT: 27 U/L (ref 0–44)
AST: 23 U/L (ref 15–41)
Albumin: 4.3 g/dL (ref 3.5–5.0)
Alkaline Phosphatase: 54 U/L (ref 38–126)
Anion gap: 9 (ref 5–15)
BUN: 14 mg/dL (ref 8–23)
CO2: 26 mmol/L (ref 22–32)
Calcium: 8.8 mg/dL — ABNORMAL LOW (ref 8.9–10.3)
Chloride: 103 mmol/L (ref 98–111)
Creatinine, Ser: 0.88 mg/dL (ref 0.61–1.24)
GFR calc Af Amer: >60 mL/min (ref >60)
GFR calc non Af Amer: >60 mL/min (ref >60)
Glucose, Bld: 98 mg/dL (ref 70–99)
Potassium: 3.7 mmol/L (ref 3.5–5.1)
Sodium: 138 mmol/L (ref 135–145)
Total Bilirubin: 0.9 mg/dL (ref 0.3–1.2)
Total Protein: 7.7 g/dL (ref 6.5–8.1)

## 2020-02-14 LAB — CBC WITH DIFFERENTIAL/PLATELET
Abs Immature Granulocytes: 0.02 10*3/uL (ref 0.00–0.07)
Basophils Absolute: 0.1 10*3/uL (ref 0.0–0.1)
Basophils Relative: 1 %
Eosinophils Absolute: 0.1 10*3/uL (ref 0.0–0.5)
Eosinophils Relative: 3 %
HCT: 46.6 % (ref 39.0–52.0)
Hemoglobin: 15.9 g/dL (ref 13.0–17.0)
Immature Granulocytes: 0 %
Lymphocytes Relative: 45 %
Lymphs Abs: 2.1 10*3/uL (ref 0.7–4.0)
MCH: 30.1 pg (ref 26.0–34.0)
MCHC: 34.1 g/dL (ref 30.0–36.0)
MCV: 88.1 fL (ref 80.0–100.0)
Monocytes Absolute: 0.5 10*3/uL (ref 0.1–1.0)
Monocytes Relative: 11 %
Neutro Abs: 1.9 10*3/uL (ref 1.7–7.7)
Neutrophils Relative %: 40 %
Platelets: 181 10*3/uL (ref 150–400)
RBC: 5.29 MIL/uL (ref 4.22–5.81)
RDW: 12.3 % (ref 11.5–15.5)
WBC: 4.7 10*3/uL (ref 4.0–10.5)
nRBC: 0 % (ref 0.0–0.2)

## 2020-02-14 MED ORDER — APIXABAN 2.5 MG PO TABS: 2.5000 mg | ORAL_TABLET | Freq: Two times a day (BID) | ORAL | 1 refills | Status: DC

## 2020-02-14 NOTE — Progress Notes (Signed)
Hematology/Oncology Follow Up Note [facility]  Telephone:([phone] Fax:([phone]  Patient Care Team: Tammi Klippel, MD as PCP - General (Family Medicine)   Name of the patient: Tyrone Coffey  [id number]  1945-12-08   REASON FOR VISIT Follow up for management of pulmonary embolism and DVT  PERTINENT HEM/ONC HISTORY 74 y.o. male with PMH listed at below who presents to ER for evaluation of pleuritic chest pain on the right side for the past few days.  He went to urgent care today in the send the patient to the hospital due to history of pulmonary embolism. CT angiogram was done which showed multiple pulmonary emboli in both lungs with right heart strain.  Heparin drip was started and patient is admitted for further evaluation and management. Lower extremity venous duplex also showed incomplete occlusive DVT in the distal right popliteal vein.   # History of recurrent PE.  He recently moved to [place] from [place].  Previous care at [facility].  Medical records reviewed. 11/13/2011 he developed extensive bilateral pulmonary emboli with evidence of right heart strain and possible lingular infarct.  At that time, he also had nonconclusive noncompressible thrombus in the left popliteal vein.  Patient reports that he Coumadin for couple of months and chemo. Per patient, this happened after hip revision surgery. Prior to his PE/DVT episodes in 2013, he also reports right upper extremity clots that developed after right shoulder history.  Reports that he took Coumadin for very short period of time.  Records not available to me at this point.  Patient denies any trauma, recent surgery, long distance car trip or air flight. #04/2018 anticoagulation with Eliquis 5 mg twice daily-switch to Eliquis 2.5 mg twice daily on 10/27/2018 for maintenance.  INTERVAL HISTORY 74 y.o. male with above hematology history reviewed by me presents for follow up of  recurrent Pulmonary embolism and DVT.  Patient reports that he has been feeling well.  No new complaints.  Denies any bleeding events Patient has been on Eliquis 2.5 mg twice daily for maintenance for unprovoked recurrent DVT/PE. Denies any shortness of breath, chest pain, abdominal pain, lower extremity swelling. He mentions a knot on his right index finger and joint stiffness.  Review of Systems  Constitutional: Negative for chills, fever, malaise/fatigue and weight loss.  HENT: Negative for nosebleeds and sore throat.   Eyes: Negative for double vision, photophobia and redness.  Respiratory: Negative for cough, shortness of breath and wheezing.   Cardiovascular: Negative for chest pain, palpitations, orthopnea and leg swelling.  Gastrointestinal: Negative for abdominal pain, blood in stool, nausea and vomiting.  Genitourinary: Negative for dysuria.  Musculoskeletal: Negative for back pain, myalgias and neck pain.  Skin: Negative for itching and rash.  Neurological: Negative for dizziness, tingling and tremors.  Endo/Heme/Allergies: Negative for environmental allergies. Does not bruise/bleed easily.  Psychiatric/Behavioral: Negative for depression and hallucinations.      Allergies  Allergen Reactions  . Amoxicillin Nausea Only    Has patient had a PCN reaction causing immediate rash, facial/tongue/throat swelling, SOB or lightheadedness with hypotension: No Has patient had a PCN reaction causing severe rash involving mucus membranes or skin necrosis: No Has patient had a PCN reaction that required hospitalization: No Has patient had a PCN reaction occurring within the last 10 years: Yes If all of the above answers are "NO", then may proceed with Cephalosporin use.     Past Medical History:  Diagnosis Date  . Arthritis   .  Hypertension   . Pulmonary embolism [facility])      Past Surgical History:  Procedure Laterality Date  . c2 - 3 vertebrae fuse    . JOINT REPLACEMENT    .  left hip replacement     . REPLACEMENT TOTAL KNEE BILATERAL    . right shoulder replacement      Social History   Socioeconomic History  . Marital status: Married    Spouse name: Myrene Buddy  . Number of children: Not on file  . Years of education: Not on file  . Highest education level: Not on file  Occupational History  . Occupation: retired  Tobacco Use  . Smoking status: Former Smoker    Quit date: 05/05/1978    Years since quitting: 41.8  . Smokeless tobacco: Never Used  . Tobacco comment: smoked 1 pack/week for about 10 years   Vaping Use  . Vaping Use: Never used  Substance and Sexual Activity  . Alcohol use: Yes    Comment: occasionally  . Drug use: Never  . Sexual activity: Not on file  Other Topics Concern  . Not on file  Social History Narrative  . Not on file   Social Determinants of Health   Financial Resource Strain:   . Difficulty of Paying Living Expenses: Not on file  Food Insecurity:   . Worried About Programme researcher, broadcasting/film/video in the Last Year: Not on file  . Ran Out of Food in the Last Year: Not on file  Transportation Needs:   . Lack of Transportation (Medical): Not on file  . Lack of Transportation (Non-Medical): Not on file  Physical Activity:   . Days of Exercise per Week: Not on file  . Minutes of Exercise per Session: Not on file  Stress:   . Feeling of Stress : Not on file  Social Connections:   . Frequency of Communication with Friends and Family: Not on file  . Frequency of Social Gatherings with Friends and Family: Not on file  . Attends Religious Services: Not on file  . Active Member of Clubs or Organizations: Not on file  . Attends Banker Meetings: Not on file  . Marital Status: Not on file  Intimate Partner Violence:   . Fear of Current or Ex-Partner: Not on file  . Emotionally Abused: Not on file  . Physically Abused: Not on file  . Sexually Abused: Not on file    Family History  Problem Relation Age of Onset  .  Arthritis Mother   . Heart Problems Mother   . Heart Problems Father   . Lung cancer Brother   . Diabetes Brother   . Kidney disease Sister      Current Outpatient Medications:  .  apixaban (ELIQUIS) 2.5 MG TABS tablet, Take 1 tablet (2.5 mg total) by mouth 2 (two) times daily., Disp: 180 tablet, Rfl: 1 .  Dextran 70-Hypromellose (ARTIFICIAL TEARS) 0.1-0.3 % SOLN, Place 1 drop into both eyes every 4 (four) hours as needed (for dry eyes). , Disp: , Rfl:  .  latanoprost (XALATAN) 0.005 % ophthalmic solution, Place 1 drop into the right eye at bedtime. , Disp: , Rfl:  .  lisinopril-hydrochlorothiazide (PRINZIDE,ZESTORETIC) 10-12.5 MG tablet, Take 1 tablet by mouth daily., Disp: , Rfl:  .  tamsulosin (FLOMAX) 0.4 MG CAPS capsule, Take by mouth., Disp: , Rfl:   Physical exam:  Vitals:   02/14/20 0953  BP: 128/86  Pulse: (!) 59  Resp: 20  Temp: 97.6  F (36.4 C)  SpO2: 100%  Weight: 205 lb 4.8 oz (93.1 kg)   Physical Exam Constitutional:      General: He is not in acute distress. HENT:     Head: Normocephalic and atraumatic.  Eyes:     General: No scleral icterus.    Pupils: Pupils are equal, round, and reactive to light.  Cardiovascular:     Rate and Rhythm: Normal rate and regular rhythm.     Heart sounds: Normal heart sounds.  Pulmonary:     Effort: Pulmonary effort is normal. No respiratory distress.     Breath sounds: No wheezing.  Abdominal:     General: Bowel sounds are normal.     Palpations: Abdomen is soft.  Musculoskeletal:        General: No deformity. Normal range of motion.     Cervical back: Normal range of motion and neck supple.     Comments: Right index finger-Heberden's node  Skin:    General: Skin is warm and dry.     Findings: No erythema or rash.  Neurological:     Mental Status: He is alert and oriented to person, place, and time. Mental status is at baseline.     Cranial Nerves: No cranial nerve deficit.     Coordination: Coordination normal.   Psychiatric:        Mood and Affect: Mood normal.     CMP Latest Ref Rng & Units 02/14/2020  Glucose 70 - 99 mg/dL 98  BUN 8 - 23 mg/dL 14  Creatinine 1.60 - 7.37 mg/dL 1.06  Sodium 269 - 485 mmol/L 138  Potassium 3.5 - 5.1 mmol/L 3.7  Chloride 98 - 111 mmol/L 103  CO2 22 - 32 mmol/L 26  Calcium 8.9 - 10.3 mg/dL 4.6(E)  Total Protein 6.5 - 8.1 g/dL 7.7  Total Bilirubin 0.3 - 1.2 mg/dL 0.9  Alkaline Phos 38 - 126 U/L 54  AST 15 - 41 U/L 23  ALT 0 - 44 U/L 27   CBC Latest Ref Rng & Units 02/14/2020  WBC 4.0 - 10.5 K/uL 4.7  Hemoglobin 13.0 - 17.0 g/dL 70.3  Hematocrit 39 - 52 % 46.6  Platelets 150 - 400 K/uL 181   Negative factor V Leiden mutation and prothrombin mutation.   Assessment and plan 1. History of pulmonary embolism   2. Heberden's nodes   3. Hypocalcemia    #Labs reviewed and discussed with patient.  He is doing very well.  Continue Eliquis 2.5 mg twice daily as maintenance. Discussed with him about option of follow-up with primary care provider versus continue follow-up with me for management of his Eliquis. Patient prefers to continue follow-up with me every 6 months for management of Eliquis. Refills were sent to pharmacy.  Hypocalcemia, discussed with patient and recommend patient to start taking oral calcium supplementation.  Heberden's nodes Knot on his right finger, nontender/joint stiffness. Discussed with patient that this is possible arthritis encourage patient to discuss with primary care provider.  Orders Placed This Encounter  Procedures  . CBC with Differential/Platelet    Standing Status:   Future    Standing Expiration Date:   02/13/2021  . Comprehensive metabolic panel    Standing Status:   Future    Standing Expiration Date:   02/13/2021      Follow up in 6 months.  Rickard Patience, MD, PhD Hematology Oncology [facility] at [facility] Pager- [phone] 02/14/2020

## 2020-08-14 ENCOUNTER — Inpatient Hospital Stay: Payer: Medicare PPO

## 2020-08-14 ENCOUNTER — Inpatient Hospital Stay: Payer: Medicare PPO | Admitting: Oncology

## 2020-09-05 ENCOUNTER — Inpatient Hospital Stay: Payer: Medicare PPO | Admitting: Oncology

## 2020-09-05 ENCOUNTER — Inpatient Hospital Stay: Payer: Medicare PPO

## 2020-09-19 ENCOUNTER — Inpatient Hospital Stay: Payer: Medicare PPO | Admitting: Oncology

## 2020-09-19 ENCOUNTER — Inpatient Hospital Stay: Payer: Medicare PPO

## 2020-09-29 ENCOUNTER — Encounter: Payer: Self-pay | Admitting: Oncology

## 2020-09-29 ENCOUNTER — Inpatient Hospital Stay: Payer: Medicare PPO

## 2020-09-29 ENCOUNTER — Other Ambulatory Visit: Payer: Self-pay

## 2020-09-29 ENCOUNTER — Inpatient Hospital Stay: Payer: Medicare PPO | Attending: Oncology | Admitting: Oncology

## 2020-09-29 VITALS — BP 127/89 | HR 77 | Temp 96.0°F | Resp 16 | Wt 188.3 lb

## 2020-09-29 DIAGNOSIS — I10 Essential (primary) hypertension: Secondary | ICD-10-CM | POA: Diagnosis not present

## 2020-09-29 DIAGNOSIS — Z86718 Personal history of other venous thrombosis and embolism: Secondary | ICD-10-CM | POA: Diagnosis not present

## 2020-09-29 DIAGNOSIS — K5909 Other constipation: Secondary | ICD-10-CM | POA: Diagnosis not present

## 2020-09-29 DIAGNOSIS — Z86711 Personal history of pulmonary embolism: Secondary | ICD-10-CM

## 2020-09-29 DIAGNOSIS — Z7901 Long term (current) use of anticoagulants: Secondary | ICD-10-CM | POA: Insufficient documentation

## 2020-09-29 DIAGNOSIS — Z79899 Other long term (current) drug therapy: Secondary | ICD-10-CM | POA: Insufficient documentation

## 2020-09-29 DIAGNOSIS — Z87891 Personal history of nicotine dependence: Secondary | ICD-10-CM | POA: Diagnosis not present

## 2020-09-29 LAB — COMPREHENSIVE METABOLIC PANEL
ALT: 31 U/L (ref 0–44)
AST: 21 U/L (ref 15–41)
Albumin: 4.4 g/dL (ref 3.5–5.0)
Alkaline Phosphatase: 54 U/L (ref 38–126)
Anion gap: 8 (ref 5–15)
BUN: 19 mg/dL (ref 8–23)
CO2: 29 mmol/L (ref 22–32)
Calcium: 9.4 mg/dL (ref 8.9–10.3)
Chloride: 100 mmol/L (ref 98–111)
Creatinine, Ser: 0.88 mg/dL (ref 0.61–1.24)
GFR, Estimated: >60 mL/min (ref >60)
Glucose, Bld: 98 mg/dL (ref 70–99)
Potassium: 4.2 mmol/L (ref 3.5–5.1)
Sodium: 137 mmol/L (ref 135–145)
Total Bilirubin: 0.8 mg/dL (ref 0.3–1.2)
Total Protein: 7.4 g/dL (ref 6.5–8.1)

## 2020-09-29 LAB — CBC WITH DIFFERENTIAL/PLATELET
Abs Immature Granulocytes: 0.02 10*3/uL (ref 0.00–0.07)
Basophils Absolute: 0 10*3/uL (ref 0.0–0.1)
Basophils Relative: 1 %
Eosinophils Absolute: 0.1 10*3/uL (ref 0.0–0.5)
Eosinophils Relative: 2 %
HCT: 43.5 % (ref 39.0–52.0)
Hemoglobin: 14.9 g/dL (ref 13.0–17.0)
Immature Granulocytes: 0 %
Lymphocytes Relative: 39 %
Lymphs Abs: 1.8 10*3/uL (ref 0.7–4.0)
MCH: 30.9 pg (ref 26.0–34.0)
MCHC: 34.3 g/dL (ref 30.0–36.0)
MCV: 90.2 fL (ref 80.0–100.0)
Monocytes Absolute: 0.5 10*3/uL (ref 0.1–1.0)
Monocytes Relative: 10 %
Neutro Abs: 2.2 10*3/uL (ref 1.7–7.7)
Neutrophils Relative %: 48 %
Platelets: 187 10*3/uL (ref 150–400)
RBC: 4.82 MIL/uL (ref 4.22–5.81)
RDW: 12.3 % (ref 11.5–15.5)
WBC: 4.7 10*3/uL (ref 4.0–10.5)
nRBC: 0 % (ref 0.0–0.2)

## 2020-09-29 MED ORDER — APIXABAN 2.5 MG PO TABS: 2.5000 mg | ORAL_TABLET | Freq: Two times a day (BID) | ORAL | 1 refills | Status: DC

## 2020-09-29 NOTE — Progress Notes (Signed)
Patient is concerned with recent constipation.  Is taking Senna as well as Miralax but still no bowel movement in the past 6 days.

## 2020-09-29 NOTE — Progress Notes (Signed)
Hematology/Oncology Follow Up Note [facility]  [phone] [phone]  Patient Care Team: Tammi Klippel, MD as PCP - General (Family Medicine)   Name of the patient: Tyrone Coffey  [id number]  [birth year]   REASON FOR VISIT Follow up for management of pulmonary embolism and DVT  PERTINENT HEM/ONC HISTORY 75 y.o. male with PMH listed at below who presents to ER for evaluation of pleuritic chest pain on the right side for the past few days.  He went to urgent care today in the send the patient to the hospital due to history of pulmonary embolism. CT angiogram was done which showed multiple pulmonary emboli in both lungs with right heart strain.  Heparin drip was started and patient is admitted for further evaluation and management. Lower extremity venous duplex also showed incomplete occlusive DVT in the distal right popliteal vein.   # History of recurrent PE.  He recently moved to [place] from [place].  Previous care at [facility].  Medical records reviewed. 11/13/2011 he developed extensive bilateral pulmonary emboli with evidence of right heart strain and possible lingular infarct.  At that time, he also had nonconclusive noncompressible thrombus in the left popliteal vein.  Patient reports that he Coumadin for couple of months and chemo. Per patient, this happened after hip revision surgery. Prior to his PE/DVT episodes in 2013, he also reports right upper extremity clots that developed after right shoulder history.  Reports that he took Coumadin for very short period of time.  Records not available to me at this point.  Patient denies any trauma, recent surgery, long distance car trip or air flight. #04/2018 anticoagulation with Eliquis 5 mg twice daily-switch to Eliquis 2.5 mg twice daily on 10/27/2018 for maintenance.  INTERVAL HISTORY 75 y.o. male with above hematology history reviewed by me presents for follow up of  recurrent Pulmonary embolism and DVT.  Patient reports feeling well.  No new complaints. 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 Denies any shortness of breath, chest pain, abdominal pain, lower extremity swelling. He reports chronic constipation, he takes senna 8.6 mg twice daily and MiraLAX with no improvement.   Review of Systems  Constitutional: Negative for chills, fever, malaise/fatigue and weight loss.  HENT: Negative for nosebleeds and sore throat.   Eyes: Negative for double vision, photophobia and redness.  Respiratory: Negative for cough, shortness of breath and wheezing.   Cardiovascular: Negative for chest pain, palpitations, orthopnea and leg swelling.  Gastrointestinal: Negative for abdominal pain, blood in stool, nausea and vomiting.  Genitourinary: Negative for dysuria.  Musculoskeletal: Negative for back pain, myalgias and neck pain.  Skin: Negative for itching and rash.  Neurological: Negative for dizziness, tingling and tremors.  Endo/Heme/Allergies: Negative for environmental allergies. Does not bruise/bleed easily.  Psychiatric/Behavioral: Negative for depression and hallucinations.      Allergies  Allergen Reactions  . Amoxicillin Nausea Only    Has patient had a PCN reaction causing immediate rash, facial/tongue/throat swelling, SOB or lightheadedness with hypotension: No Has patient had a PCN reaction causing severe rash involving mucus membranes or skin necrosis: No Has patient had a PCN reaction that required hospitalization: No Has patient had a PCN reaction occurring within the last 10 years: Yes If all of the above answers are "NO", then may proceed with Cephalosporin use.     Past Medical History:  Diagnosis Date  . Arthritis   . Hypertension   . Pulmonary embolism (  [facility])      Past Surgical History:  Procedure Laterality Date  . c2 - 3 vertebrae fuse    . JOINT REPLACEMENT    . left hip  replacement     . REPLACEMENT TOTAL KNEE BILATERAL    . right shoulder replacement      Social History   Socioeconomic History  . Marital status: Married    Spouse name: Myrene Buddy  . Number of children: Not on file  . Years of education: Not on file  . Highest education level: Not on file  Occupational History  . Occupation: retired  Tobacco Use  . Smoking status: Former Smoker    Quit date: 05/05/1978    Years since quitting: 42.4  . Smokeless tobacco: Never Used  . Tobacco comment: smoked 1 pack/week for about 10 years   Vaping Use  . Vaping Use: Never used  Substance and Sexual Activity  . Alcohol use: Yes    Comment: occasionally  . Drug use: Never  . Sexual activity: Not on file  Other Topics Concern  . Not on file  Social History Narrative  . Not on file   Social Determinants of Health   Financial Resource Strain: Not on file  Food Insecurity: Not on file  Transportation Needs: Not on file  Physical Activity: Not on file  Stress: Not on file  Social Connections: Not on file  Intimate Partner Violence: Not on file    Family History  Problem Relation Age of Onset  . Arthritis Mother   . Heart Problems Mother   . Heart Problems Father   . Lung cancer Brother   . Diabetes Brother   . Kidney disease Sister      Current Outpatient Medications:  .  apixaban (ELIQUIS) 2.5 MG TABS tablet, Take 1 tablet (2.5 mg total) by mouth 2 (two) times daily., Disp: 180 tablet, Rfl: 1 .  Dextran 70-Hypromellose 0.1-0.3 % SOLN, Place 1 drop into both eyes every 4 (four) hours as needed (for dry eyes). , Disp: , Rfl:  .  latanoprost (XALATAN) 0.005 % ophthalmic solution, Place 1 drop into the right eye at bedtime. , Disp: , Rfl:  .  lisinopril-hydrochlorothiazide (PRINZIDE,ZESTORETIC) 10-12.5 MG tablet, Take 1 tablet by mouth daily., Disp: , Rfl:  .  polyethylene glycol powder (GLYCOLAX/MIRALAX) 17 GM/SCOOP powder, Take by mouth once. 1 capful bid prn, Disp: , Rfl:  .  senna  (SENOKOT) 8.6 MG tablet, TAKE ONE TABLET BY MOUTH TWO TIMES A DAY FOR CONSTIPATION, Disp: , Rfl:  .  tamsulosin (FLOMAX) 0.4 MG CAPS capsule, Take by mouth., Disp: , Rfl:   Physical exam:  Vitals:   09/29/20 1009  BP: 127/89  Pulse: 77  Resp: 16  Temp: (!) 96 F (35.6 C)  TempSrc: Tympanic  SpO2: 99%  Weight: 188 lb 4.8 oz (85.4 kg)   Physical Exam Constitutional:      General: He is not in acute distress. HENT:     Head: Normocephalic and atraumatic.  Eyes:     General: No scleral icterus.    Pupils: Pupils are equal, round, and reactive to light.  Cardiovascular:     Rate and Rhythm: Normal rate and regular rhythm.     Heart sounds: Normal heart sounds.  Pulmonary:     Effort: Pulmonary effort is normal. No respiratory distress.     Breath sounds: No wheezing.  Abdominal:     General: Bowel sounds are normal.     Palpations: Abdomen is  soft.  Musculoskeletal:        General: No deformity. Normal range of motion.     Cervical back: Normal range of motion and neck supple.  Skin:    General: Skin is warm and dry.     Findings: No erythema or rash.  Neurological:     Mental Status: He is alert and oriented to person, place, and time. Mental status is at baseline.     Cranial Nerves: No cranial nerve deficit.     Coordination: Coordination normal.  Psychiatric:        Mood and Affect: Mood normal.     CMP Latest Ref Rng & Units 02/14/2020  Glucose 70 - 99 mg/dL 98  BUN 8 - 23 mg/dL 14  Creatinine 8.25 - 0.03 mg/dL 7.04  Sodium 888 - 916 mmol/L 138  Potassium 3.5 - 5.1 mmol/L 3.7  Chloride 98 - 111 mmol/L 103  CO2 22 - 32 mmol/L 26  Calcium 8.9 - 10.3 mg/dL 9.4(H)  Total Protein 6.5 - 8.1 g/dL 7.7  Total Bilirubin 0.3 - 1.2 mg/dL 0.9  Alkaline Phos 38 - 126 U/L 54  AST 15 - 41 U/L 23  ALT 0 - 44 U/L 27   CBC Latest Ref Rng & Units 09/29/2020  WBC 4.0 - 10.5 K/uL 4.7  Hemoglobin 13.0 - 17.0 g/dL 03.8  Hematocrit 88.2 - 52.0 % 43.5  Platelets 150 - 400 K/uL  187   Negative factor V Leiden mutation and prothrombin mutation.   Assessment and plan 1. History of pulmonary embolism   2. History of DVT (deep vein thrombosis)    #Labs reviewed and discussed with patient. Clinically he is doing very well Continue Eliquis 2.5 mg twice daily as prophylaxis. Refills were sent to pharmacy  Constipation, recommend patient to increase senna to 17.2 mg twice daily, continue MiraLAX daily as needed.  If no symptom improvement, advised patient to further discuss with primary care provider for management.  Recommend dietary fiber  Follow up in 6 months.  Rickard Patience, MD, PhD Hematology Oncology [facility] at [facility] Pager- [phone] 09/29/2020

## 2020-10-09 IMAGING — CR DG CHEST 2V
2 series · 2 of 2 positions shown · non-contrast
Comparison: April 22, 2018

CLINICAL DATA: Chest pain

EXAM:
CHEST - 2 VIEW

[chest pa]
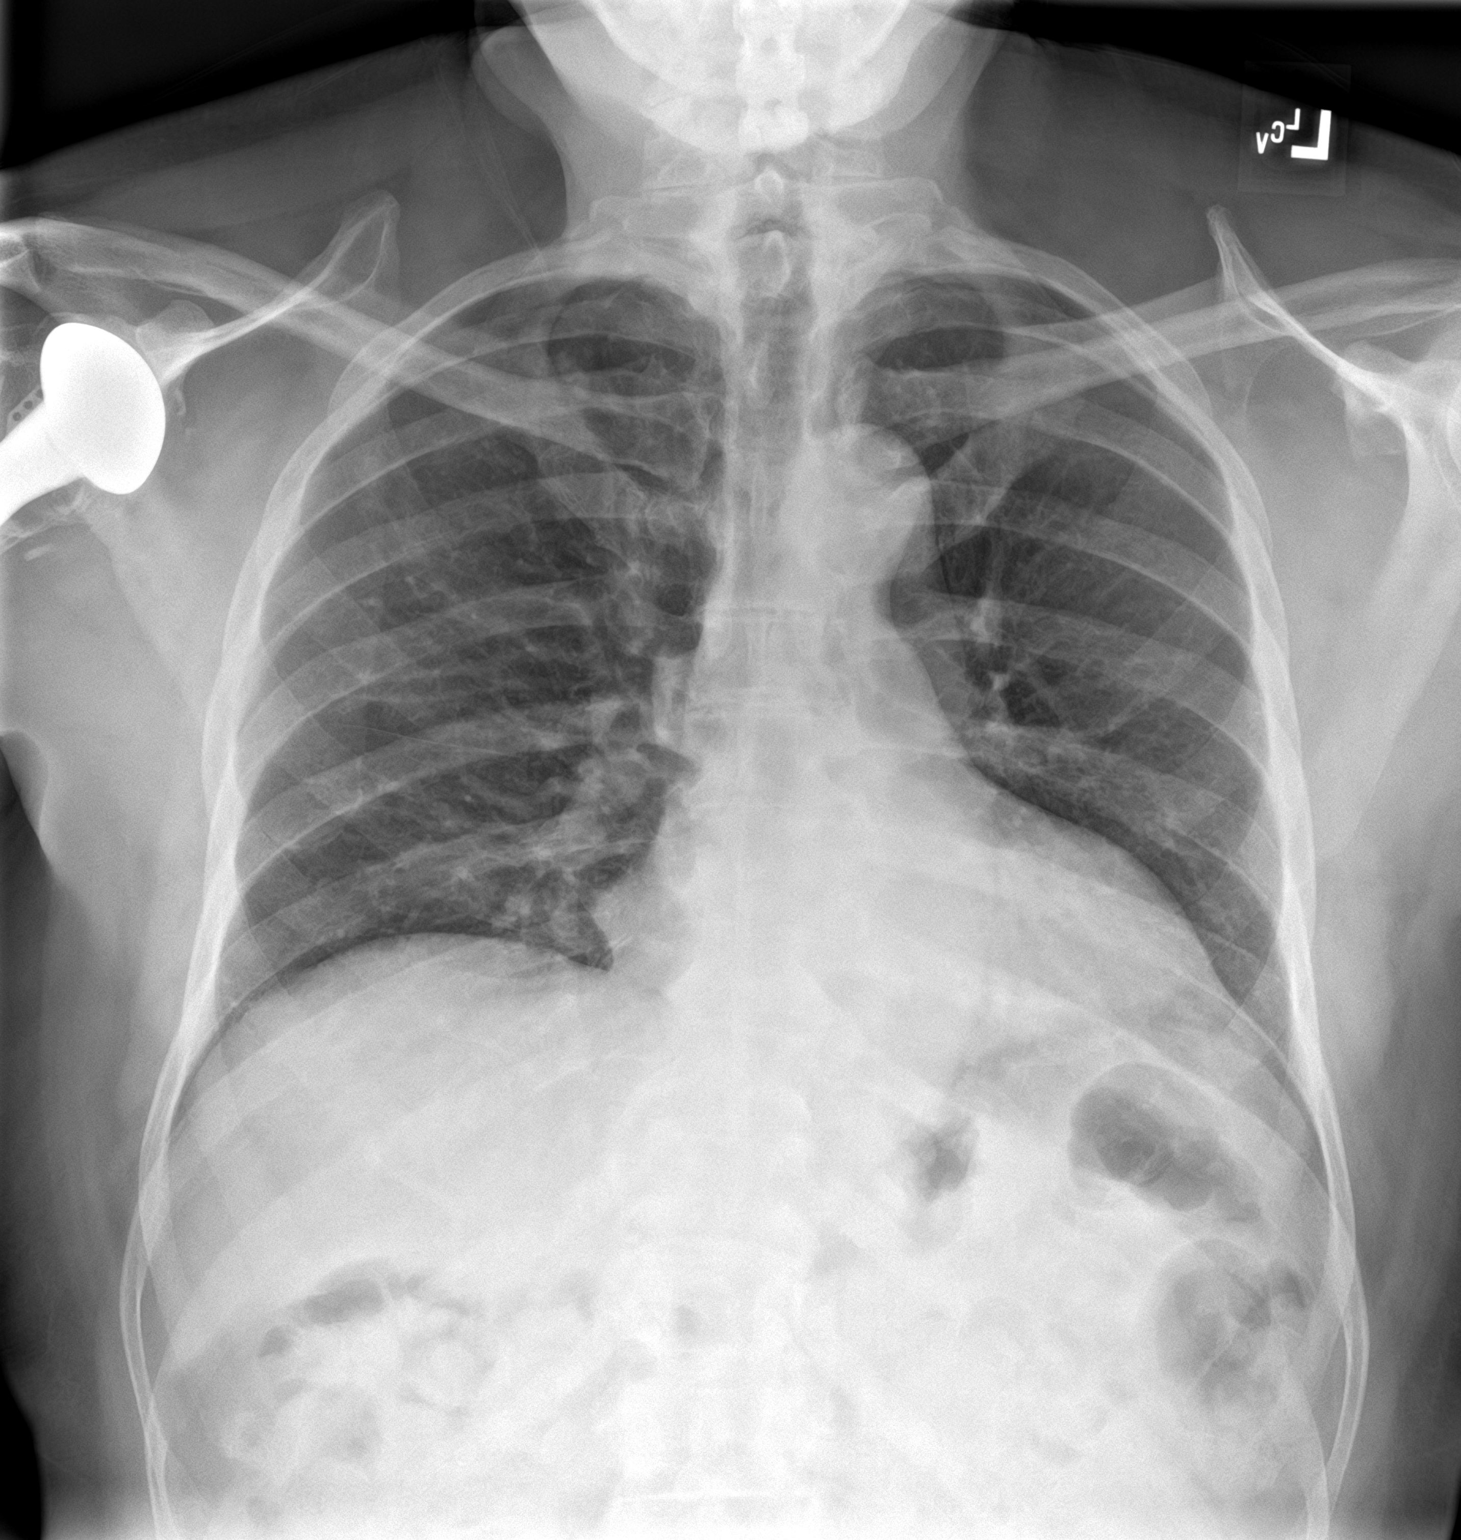

[chest lat]
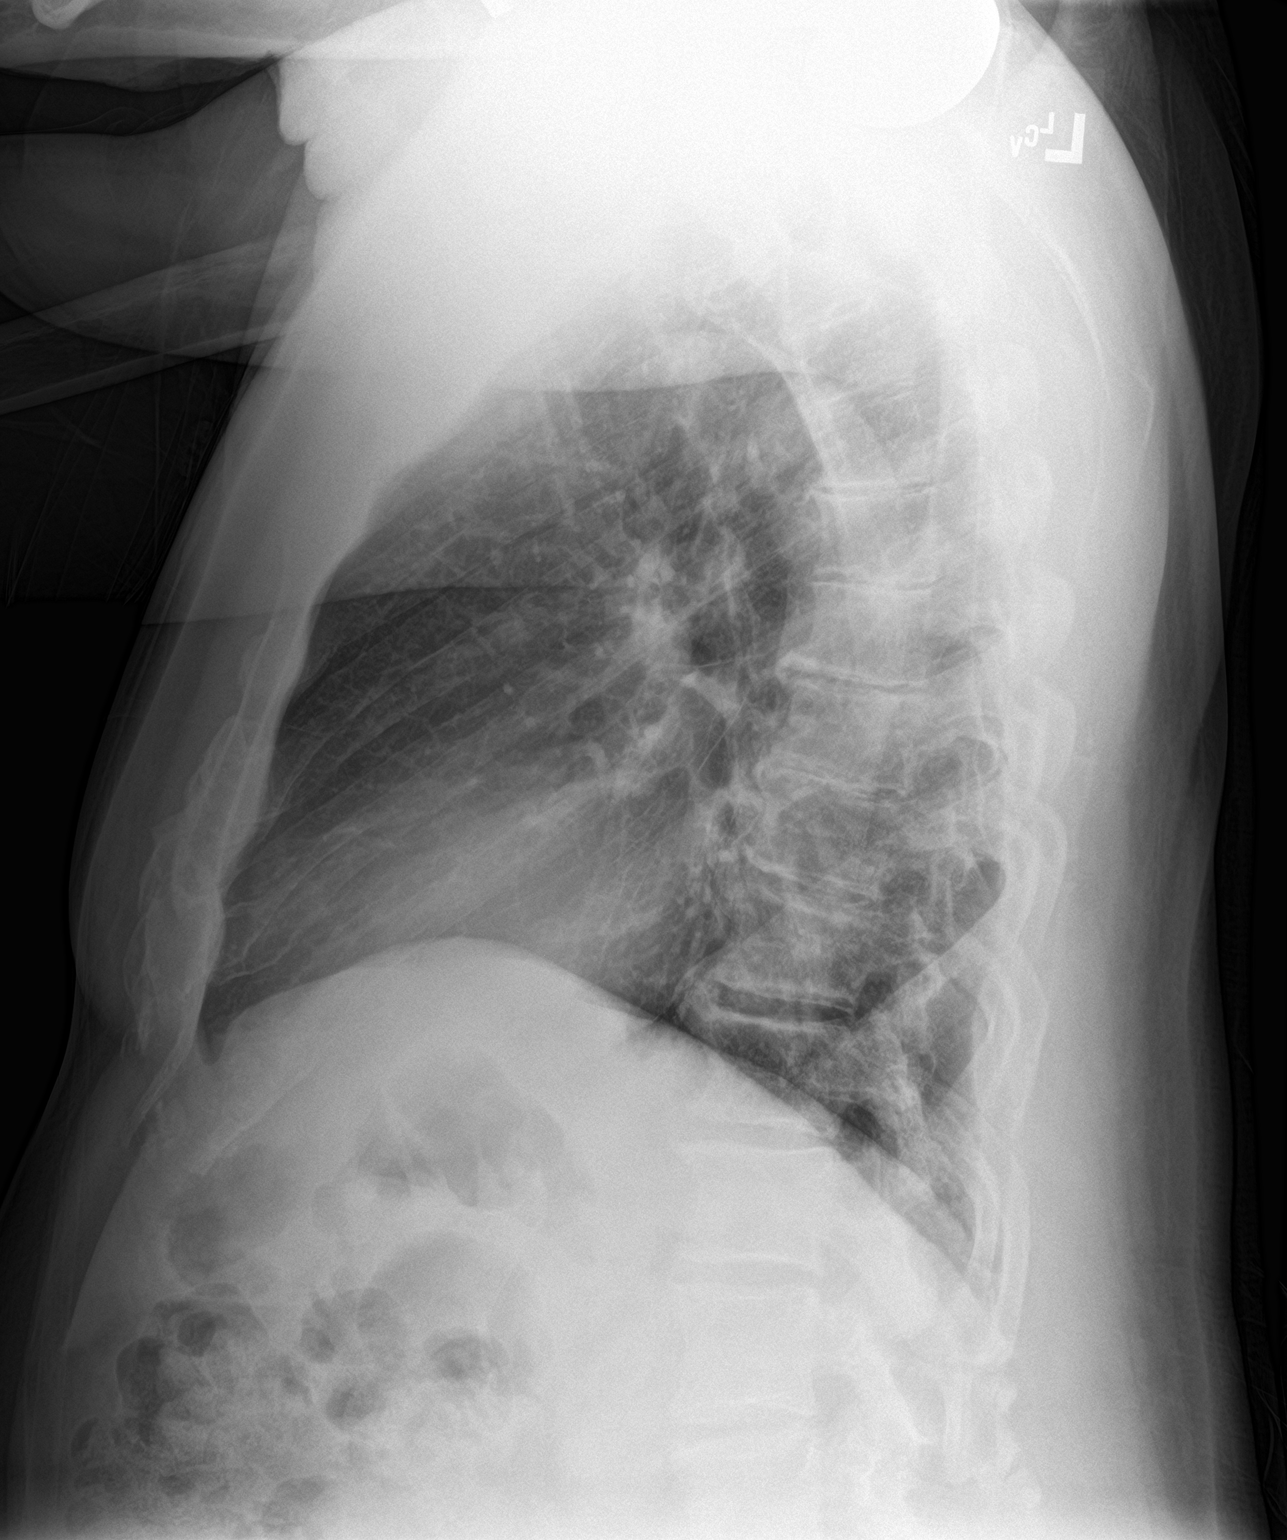

[2 of 2 positions shown; findings below may reference images not displayed]

FINDINGS: The lungs are clear. The heart size and pulmonary vascularity are
normal. No adenopathy. No pneumothorax. There is degenerative change
in the thoracic spine. There is a total shoulder replacement on the
right. Postoperative change noted in lower cervical spine region.
IMPRESSION: Lungs clear.  Cardiac silhouette within normal limits.

## 2020-10-25 ENCOUNTER — Emergency Department
Admission: EM | Admit: 2020-10-25 | Discharge: 2020-10-25 | Disposition: A | Discharge status: Home / Self Care | Payer: Medicare PPO | Attending: Emergency Medicine | Admitting: Emergency Medicine

## 2020-10-25 ENCOUNTER — Other Ambulatory Visit: Payer: Self-pay

## 2020-10-25 DIAGNOSIS — R42 Dizziness and giddiness: Secondary | ICD-10-CM | POA: Diagnosis present

## 2020-10-25 DIAGNOSIS — Z5321 Procedure and treatment not carried out due to patient leaving prior to being seen by health care provider: Secondary | ICD-10-CM | POA: Insufficient documentation

## 2020-10-25 DIAGNOSIS — I10 Essential (primary) hypertension: Secondary | ICD-10-CM | POA: Insufficient documentation

## 2020-10-25 LAB — BASIC METABOLIC PANEL
Anion gap: 9 (ref 5–15)
BUN: 19 mg/dL (ref 8–23)
CO2: 23 mmol/L (ref 22–32)
Calcium: 9.5 mg/dL (ref 8.9–10.3)
Chloride: 101 mmol/L (ref 98–111)
Creatinine, Ser: 1.16 mg/dL (ref 0.61–1.24)
GFR, Estimated: >60 mL/min (ref >60)
Glucose, Bld: 129 mg/dL — ABNORMAL HIGH (ref 70–99)
Potassium: 3.3 mmol/L — ABNORMAL LOW (ref 3.5–5.1)
Sodium: 133 mmol/L — ABNORMAL LOW (ref 135–145)

## 2020-10-25 LAB — CBC
HCT: 43.8 % (ref 39.0–52.0)
Hemoglobin: 15.3 g/dL (ref 13.0–17.0)
MCH: 30.8 pg (ref 26.0–34.0)
MCHC: 34.9 g/dL (ref 30.0–36.0)
MCV: 88.1 fL (ref 80.0–100.0)
Platelets: 226 10*3/uL (ref 150–400)
RBC: 4.97 MIL/uL (ref 4.22–5.81)
RDW: 12.4 % (ref 11.5–15.5)
WBC: 6 10*3/uL (ref 4.0–10.5)
nRBC: 0 % (ref 0.0–0.2)

## 2020-10-25 LAB — TROPONIN I (HIGH SENSITIVITY): Troponin I (High Sensitivity): 6 ng/L (ref <18)

## 2020-10-25 LAB — CBG MONITORING, ED: Glucose-Capillary: 147 mg/dL — ABNORMAL HIGH (ref 70–99)

## 2020-10-25 NOTE — ED Triage Notes (Signed)
Pt to ED POV for dizziness for past couple days, states has been fasting and has been hypotensive.  Has lost over 20 pounds from fasting, hx of HTN and took BP meds this am  Pt states his chest feels "tired" but not chest pain

## 2021-03-30 ENCOUNTER — Ambulatory Visit
Admission: RE | Admit: 2021-03-30 | Discharge: 2021-03-30 | Disposition: A | Discharge status: Home / Self Care | Payer: Medicare PPO | Attending: Oncology | Admitting: Oncology

## 2021-03-30 ENCOUNTER — Inpatient Hospital Stay: Payer: Medicare PPO | Attending: Oncology

## 2021-03-30 ENCOUNTER — Inpatient Hospital Stay: Payer: Medicare PPO | Admitting: Oncology

## 2021-03-30 ENCOUNTER — Ambulatory Visit
Admission: RE | Admit: 2021-03-30 | Discharge: 2021-03-30 | Disposition: A | Discharge status: Home / Self Care | Payer: Medicare PPO | Source: Ambulatory Visit | Attending: Oncology | Admitting: Oncology

## 2021-03-30 VITALS — BP 124/90 | HR 76 | Temp 98.3°F | Resp 16 | Wt 175.5 lb

## 2021-03-30 DIAGNOSIS — K5909 Other constipation: Secondary | ICD-10-CM | POA: Diagnosis not present

## 2021-03-30 DIAGNOSIS — R1032 Left lower quadrant pain: Secondary | ICD-10-CM | POA: Insufficient documentation

## 2021-03-30 DIAGNOSIS — Z86711 Personal history of pulmonary embolism: Secondary | ICD-10-CM

## 2021-03-30 LAB — CBC WITH DIFFERENTIAL/PLATELET
Abs Immature Granulocytes: 0.01 10*3/uL (ref 0.00–0.07)
Basophils Absolute: 0 10*3/uL (ref 0.0–0.1)
Basophils Relative: 1 %
Eosinophils Absolute: 0.1 10*3/uL (ref 0.0–0.5)
Eosinophils Relative: 2 %
HCT: 45.2 % (ref 39.0–52.0)
Hemoglobin: 15.2 g/dL (ref 13.0–17.0)
Immature Granulocytes: 0 %
Lymphocytes Relative: 52 %
Lymphs Abs: 2.1 10*3/uL (ref 0.7–4.0)
MCH: 30.5 pg (ref 26.0–34.0)
MCHC: 33.6 g/dL (ref 30.0–36.0)
MCV: 90.8 fL (ref 80.0–100.0)
Monocytes Absolute: 0.4 10*3/uL (ref 0.1–1.0)
Monocytes Relative: 9 %
Neutro Abs: 1.4 10*3/uL — ABNORMAL LOW (ref 1.7–7.7)
Neutrophils Relative %: 36 %
Platelets: 182 10*3/uL (ref 150–400)
RBC: 4.98 MIL/uL (ref 4.22–5.81)
RDW: 12.4 % (ref 11.5–15.5)
WBC: 4 10*3/uL (ref 4.0–10.5)
nRBC: 0 % (ref 0.0–0.2)

## 2021-03-30 LAB — COMPREHENSIVE METABOLIC PANEL
ALT: 13 U/L (ref 0–44)
AST: 18 U/L (ref 15–41)
Albumin: 4.1 g/dL (ref 3.5–5.0)
Alkaline Phosphatase: 62 U/L (ref 38–126)
Anion gap: 8 (ref 5–15)
BUN: 14 mg/dL (ref 8–23)
CO2: 29 mmol/L (ref 22–32)
Calcium: 9.1 mg/dL (ref 8.9–10.3)
Chloride: 100 mmol/L (ref 98–111)
Creatinine, Ser: 0.88 mg/dL (ref 0.61–1.24)
GFR, Estimated: >60 mL/min (ref >60)
Glucose, Bld: 99 mg/dL (ref 70–99)
Potassium: 3.6 mmol/L (ref 3.5–5.1)
Sodium: 137 mmol/L (ref 135–145)
Total Bilirubin: 0.9 mg/dL (ref 0.3–1.2)
Total Protein: 7.6 g/dL (ref 6.5–8.1)

## 2021-03-30 NOTE — Progress Notes (Signed)
Hematology/Oncology Follow Up Note [facility]  Telephone:([phone] [phone]  Patient Care Team: Tammi Klippel, MD as PCP - General (Family Medicine)   Name of the patient: Tyrone Coffey  [id number]  [birth year]   REASON FOR VISIT Follow up for management of pulmonary embolism and DVT  PERTINENT HEM/ONC HISTORY 75 y.o. male with PMH listed at below who presents to ER for evaluation of pleuritic chest pain on the right side for the past few days.  He went to urgent care today in the send the patient to the hospital due to history of pulmonary embolism. CT angiogram was done which showed multiple pulmonary emboli in both lungs with right heart strain.  Heparin drip was started and patient is admitted for further evaluation and management. Lower extremity venous duplex also showed incomplete occlusive DVT in the distal right popliteal vein.    # History of recurrent PE.  He recently moved to [place] from [place].  Previous care at [facility].  Medical records reviewed. 11/13/2011 he developed extensive bilateral pulmonary emboli with evidence of right heart strain and possible lingular infarct.  At that time, he also had nonconclusive noncompressible thrombus in the left popliteal vein.  Patient reports that he Coumadin for couple of months and chemo. Per patient, this happened after hip revision surgery. Prior to his PE/DVT episodes in 2013, he also reports right upper extremity clots that developed after right shoulder history.  Reports that he took Coumadin for very short period of time.  Records not available to me at this point.   Patient denies any trauma, recent surgery, long distance car trip or air flight. #04/2018 anticoagulation with Eliquis 5 mg twice daily-switch to Eliquis 2.5 mg twice daily on 10/27/2018 for maintenance.  INTERVAL HISTORY Tyrone Coffey presents back for follow-up for recurrent pulmonary embolus and DVT.  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  Denies any bleeding episodes.  Reports left lower quadrant abdominal pain for the past 3 to 4 months that happens 1-2 times per week first thing in the morning.  Denies any injury.  Reports he does not eat late at night.  Denies acid reflux.  Reports constipation and takes stool softeners.  He has not had a BM in 1 to 2 days.  Previously he has had a colonoscopy/EGD that he feels he will need one soon.  Denies any bleeding.  Review of Systems  Constitutional:  Negative for chills, fever, malaise/fatigue and weight loss.  HENT:  Negative for nosebleeds and sore throat.   Eyes:  Negative for double vision, photophobia and redness.  Respiratory:  Negative for cough, shortness of breath and wheezing.   Cardiovascular:  Negative for chest pain, palpitations, orthopnea and leg swelling.  Gastrointestinal:  Positive for abdominal pain (LLQ). Negative for blood in stool, nausea and vomiting.  Genitourinary:  Negative for dysuria.  Musculoskeletal:  Negative for back pain, myalgias and neck pain.  Skin:  Negative for itching and rash.  Neurological:  Negative for dizziness, tingling and tremors.  Endo/Heme/Allergies:  Negative for environmental allergies. Does not bruise/bleed easily.  Psychiatric/Behavioral:  Negative for depression and hallucinations.      Allergies  Allergen Reactions   Amoxicillin Nausea Only    Has patient had a PCN reaction causing immediate rash, facial/tongue/throat swelling, SOB or lightheadedness with hypotension: No Has patient had a PCN reaction causing severe rash involving mucus membranes or  skin necrosis: No Has patient had a PCN reaction that required hospitalization: No Has patient had a PCN reaction occurring within the last 10 years: Yes If all of the above answers are "NO", then may proceed with Cephalosporin use.     Past Medical History:  Diagnosis  Date   Arthritis    Hypertension    Pulmonary embolism (HCC)      Past Surgical History:  Procedure Laterality Date   c2 - 3 vertebrae fuse     JOINT REPLACEMENT     left hip replacement      REPLACEMENT TOTAL KNEE BILATERAL     right shoulder replacement      Social History   Socioeconomic History   Marital status: Married    Spouse name: Tyrone Coffey   Number of children: Not on file   Years of education: Not on file   Highest education level: Not on file  Occupational History   Occupation: retired  Tobacco Use   Smoking status: Former    Types: Cigarettes    Quit date: 05/05/1978    Years since quitting: 42.9   Smokeless tobacco: Never   Tobacco comments:    smoked 1 pack/week for about 10 years   Vaping Use   Vaping Use: Never used  Substance and Sexual Activity   Alcohol use: Yes    Comment: occasionally   Drug use: Never   Sexual activity: Not on file  Other Topics Concern   Not on file  Social History Narrative   Not on file   Social Determinants of Health   Financial Resource Strain: Not on file  Food Insecurity: Not on file  Transportation Needs: Not on file  Physical Activity: Not on file  Stress: Not on file  Social Connections: Not on file  Intimate Partner Violence: Not on file    Family History  Problem Relation Age of Onset   Arthritis Mother    Heart Problems Mother    Heart Problems Father    Lung cancer Brother    Diabetes Brother    Kidney disease Sister      Current Outpatient Medications:    apixaban (ELIQUIS) 2.5 MG TABS tablet, Take 1 tablet (2.5 mg total) by mouth 2 (two) times daily., Disp: 180 tablet, Rfl: 1   Dextran 70-Hypromellose 0.1-0.3 % SOLN, Place 1 drop into both eyes every 4 (four) hours as needed (for dry eyes). , Disp: , Rfl:    latanoprost (XALATAN) 0.005 % ophthalmic solution, Place 1 drop into the right eye at bedtime. , Disp: , Rfl:    lisinopril-hydrochlorothiazide (PRINZIDE,ZESTORETIC) 10-12.5 MG tablet,  Take 1 tablet by mouth daily., Disp: , Rfl:    polyethylene glycol powder (GLYCOLAX/MIRALAX) 17 GM/SCOOP powder, Take by mouth once. 1 capful bid prn, Disp: , Rfl:    senna (SENOKOT) 8.6 MG tablet, TAKE ONE TABLET BY MOUTH TWO TIMES A DAY FOR CONSTIPATION, Disp: , Rfl:   Physical exam:  Vitals:   03/30/21 0933  BP: 124/90  Pulse: 76  Resp: 16  Temp: 98.3 F (36.8 C)  SpO2: 97%  Weight: 175 lb 8 oz (79.6 kg)   Physical Exam Constitutional:      General: He is not in acute distress. HENT:     Head: Normocephalic and atraumatic.  Eyes:     General: No scleral icterus.    Pupils: Pupils are equal, round, and reactive to light.  Cardiovascular:     Rate and Rhythm: Normal rate and regular rhythm.  Heart sounds: Normal heart sounds.  Pulmonary:     Effort: Pulmonary effort is normal. No respiratory distress.     Breath sounds: No wheezing.  Abdominal:     General: Bowel sounds are normal.     Palpations: Abdomen is soft.  Musculoskeletal:        General: No deformity. Normal range of motion.     Cervical back: Normal range of motion and neck supple.  Skin:    General: Skin is warm and dry.     Findings: No erythema or rash.  Neurological:     Mental Status: He is alert and oriented to person, place, and time. Mental status is at baseline.     Cranial Nerves: No cranial nerve deficit.     Coordination: Coordination normal.  Psychiatric:        Mood and Affect: Mood normal.    CMP Latest Ref Rng & Units 03/30/2021  Glucose 70 - 99 mg/dL 99  BUN 8 - 23 mg/dL 14  Creatinine 9.29 - 2.44 mg/dL 6.28  Sodium 638 - 177 mmol/L 137  Potassium 3.5 - 5.1 mmol/L 3.6  Chloride 98 - 111 mmol/L 100  CO2 22 - 32 mmol/L 29  Calcium 8.9 - 10.3 mg/dL 9.1  Total Protein 6.5 - 8.1 g/dL 7.6  Total Bilirubin 0.3 - 1.2 mg/dL 0.9  Alkaline Phos 38 - 126 U/L 62  AST 15 - 41 U/L 18  ALT 0 - 44 U/L 13   CBC Latest Ref Rng & Units 03/30/2021  WBC 4.0 - 10.5 K/uL 4.0  Hemoglobin 13.0 - 17.0  g/dL 11.6  Hematocrit 57.9 - 52.0 % 45.2  Platelets 150 - 400 K/uL 182   Negative factor V Leiden mutation and prothrombin mutation.   Assessment and plan: Mr. Adriene is a 75 year old male who presents for routine follow-up on anticoagulation for recurrent PE/DVT with acute onset LUQ X 2-3 months.   1. Left lower quadrant abdominal pain   2. History of pulmonary embolism   3. Other constipation    Recurrent PE/DVT- Is stable on anticoagulation.  Denies any concerns.  He continues 2.5 mg twice daily as prophylaxis.  Left lower quadrant abdominal pain- Unclear etiology.  He has not had a recent colonoscopy although previous was negative.  He has intermittent constipation and uses OTC MiraLAX with relief.  Has noticed left lower quadrant abdominal pain mainly in the middle of the night several times per week for the past 2 to 3 months.  Symptoms typically resolve on their own.  On assessment, abdominal pain is not reproducible.  Recommend abdominal imaging.  X-ray of his abdomen showed nonobstructing bowel gas pattern and moderate large volume of stool throughout the colon.  Notified patient of results and encouraged him to use stool softeners and laxatives to have a good bowel movement.  Otherwise, no evidence of abnormality.  If this is persistent, recommend to follow-up with GI/PCP.   Disposition- Follow-up with Dr. Cathie Hoops in 6 months.  I spent 25 minutes dedicated to the care of this patient (face-to-face and non-face-to-face) on the date of the encounter to include what is described in the assessment and plan.  Durenda Hurt, NP 04/02/2021 10:35 AM

## 2021-09-18 ENCOUNTER — Other Ambulatory Visit: Payer: Self-pay | Admitting: *Deleted

## 2021-09-18 DIAGNOSIS — R1032 Left lower quadrant pain: Secondary | ICD-10-CM

## 2021-09-18 DIAGNOSIS — Z86711 Personal history of pulmonary embolism: Secondary | ICD-10-CM

## 2021-09-28 ENCOUNTER — Encounter: Payer: Self-pay | Admitting: Oncology

## 2021-09-28 ENCOUNTER — Inpatient Hospital Stay: Payer: Medicare PPO | Attending: Oncology

## 2021-09-28 ENCOUNTER — Inpatient Hospital Stay: Payer: Medicare PPO | Admitting: Oncology

## 2021-09-28 VITALS — BP 124/96 | HR 60 | Temp 98.7°F | Resp 20 | Wt 183.3 lb

## 2021-09-28 DIAGNOSIS — Z79899 Other long term (current) drug therapy: Secondary | ICD-10-CM | POA: Insufficient documentation

## 2021-09-28 DIAGNOSIS — Z87891 Personal history of nicotine dependence: Secondary | ICD-10-CM | POA: Diagnosis not present

## 2021-09-28 DIAGNOSIS — Z7901 Long term (current) use of anticoagulants: Secondary | ICD-10-CM | POA: Insufficient documentation

## 2021-09-28 DIAGNOSIS — Z86711 Personal history of pulmonary embolism: Secondary | ICD-10-CM | POA: Diagnosis present

## 2021-09-28 DIAGNOSIS — I10 Essential (primary) hypertension: Secondary | ICD-10-CM | POA: Insufficient documentation

## 2021-09-28 DIAGNOSIS — K5909 Other constipation: Secondary | ICD-10-CM | POA: Diagnosis not present

## 2021-09-28 DIAGNOSIS — Z86718 Personal history of other venous thrombosis and embolism: Secondary | ICD-10-CM | POA: Insufficient documentation

## 2021-09-28 DIAGNOSIS — R1032 Left lower quadrant pain: Secondary | ICD-10-CM

## 2021-09-28 LAB — CBC WITH DIFFERENTIAL/PLATELET
Abs Immature Granulocytes: 0.01 10*3/uL (ref 0.00–0.07)
Basophils Absolute: 0 10*3/uL (ref 0.0–0.1)
Basophils Relative: 1 %
Eosinophils Absolute: 0.1 10*3/uL (ref 0.0–0.5)
Eosinophils Relative: 3 %
HCT: 45.1 % (ref 39.0–52.0)
Hemoglobin: 15.1 g/dL (ref 13.0–17.0)
Immature Granulocytes: 0 %
Lymphocytes Relative: 42 %
Lymphs Abs: 1.6 10*3/uL (ref 0.7–4.0)
MCH: 30.5 pg (ref 26.0–34.0)
MCHC: 33.5 g/dL (ref 30.0–36.0)
MCV: 91.1 fL (ref 80.0–100.0)
Monocytes Absolute: 0.4 10*3/uL (ref 0.1–1.0)
Monocytes Relative: 10 %
Neutro Abs: 1.7 10*3/uL (ref 1.7–7.7)
Neutrophils Relative %: 44 %
Platelets: 173 10*3/uL (ref 150–400)
RBC: 4.95 MIL/uL (ref 4.22–5.81)
RDW: 12.7 % (ref 11.5–15.5)
WBC: 3.7 10*3/uL — ABNORMAL LOW (ref 4.0–10.5)
nRBC: 0 % (ref 0.0–0.2)

## 2021-09-28 LAB — COMPREHENSIVE METABOLIC PANEL
ALT: 14 U/L (ref 0–44)
AST: 20 U/L (ref 15–41)
Albumin: 4.3 g/dL (ref 3.5–5.0)
Alkaline Phosphatase: 63 U/L (ref 38–126)
Anion gap: 7 (ref 5–15)
BUN: 10 mg/dL (ref 8–23)
CO2: 29 mmol/L (ref 22–32)
Calcium: 9.3 mg/dL (ref 8.9–10.3)
Chloride: 102 mmol/L (ref 98–111)
Creatinine, Ser: 0.93 mg/dL (ref 0.61–1.24)
GFR, Estimated: >60 mL/min (ref >60)
Glucose, Bld: 97 mg/dL (ref 70–99)
Potassium: 4 mmol/L (ref 3.5–5.1)
Sodium: 138 mmol/L (ref 135–145)
Total Bilirubin: 0.9 mg/dL (ref 0.3–1.2)
Total Protein: 7.4 g/dL (ref 6.5–8.1)

## 2021-09-28 MED ORDER — APIXABAN 2.5 MG PO TABS: 2.5000 mg | ORAL_TABLET | Freq: Two times a day (BID) | ORAL | 1 refills | Status: AC

## 2021-09-28 NOTE — Progress Notes (Signed)
? ? ?Hematology/Oncology Follow Up Note ?[facility]  ?[phone] [phone] ? ?Patient Care Team: ?Sotolongo, Christiana Pellant, MD as PCP - General (Family Medicine)  ? ?Name of the patient: Tyrone Coffey  ?[id number]  ?[birth year]  ? ?REASON FOR VISIT ?Follow up for management of pulmonary embolism and DVT ? ?PERTINENT HEM/ONC HISTORY ?76 y.o. male with PMH listed at below who presents to ER for evaluation of pleuritic chest pain on the right side for the past few days.  He went to urgent care today in the send the patient to the hospital due to history of pulmonary embolism. ?CT angiogram was done which showed multiple pulmonary emboli in both lungs with right heart strain.  Heparin drip was started and patient is admitted for further evaluation and management. Lower extremity venous duplex also showed incomplete occlusive DVT in the distal right popliteal vein.  ?  ?# History of recurrent PE.  He recently moved to [place] from [place].  Previous care at [facility].  Medical records reviewed. ?11/13/2011 he developed extensive bilateral pulmonary emboli with evidence of right heart strain and possible lingular infarct.  At that time, he also had nonconclusive noncompressible thrombus in the left popliteal vein.  Patient reports that he Coumadin for couple of months and chemo. ?Per patient, this happened after hip revision surgery. ?Prior to his PE/DVT episodes in 2013, he also reports right upper extremity clots that developed after right shoulder history.  Reports that he took Coumadin for very short period of time.  Records not available to me at this point. ?  ?Patient denies any trauma, recent surgery, long distance car trip or air flight. ?#04/2018 anticoagulation with Eliquis 5 mg twice daily-switch to Eliquis 2.5 mg twice daily on 10/27/2018 for maintenance. ? ?INTERVAL HISTORY ?76 y.o. male with above hematology history reviewed by me presents for follow up of  recurrent Pulmonary embolism and DVT.  ?Patient reports feeling well.  No new complaints. 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 ?Patient reports doing well.  Denies any shortness of breath, chest pain, abdominal pain, no extremity swelling. ?He follows up with his primary care provider at [facility] annually. ?Today he has no new concerns. ?Review of Systems  ?Constitutional:  Negative for chills, fever, malaise/fatigue and weight loss.  ?HENT:  Negative for nosebleeds and sore throat.   ?Eyes:  Negative for double vision, photophobia and redness.  ?Respiratory:  Negative for cough, shortness of breath and wheezing.   ?Cardiovascular:  Negative for chest pain, palpitations, orthopnea and leg swelling.  ?Gastrointestinal:  Negative for abdominal pain, blood in stool, nausea and vomiting.  ?Genitourinary:  Negative for dysuria.  ?Musculoskeletal:  Negative for back pain, myalgias and neck pain.  ?Skin:  Negative for itching and rash.  ?Neurological:  Negative for dizziness, tingling and tremors.  ?Endo/Heme/Allergies:  Negative for environmental allergies. Does not bruise/bleed easily.  ?Psychiatric/Behavioral:  Negative for depression and hallucinations.    ? ? ?Allergies  ?Allergen Reactions  ? Amoxicillin Nausea Only  ?  Has patient had a PCN reaction causing immediate rash, facial/tongue/throat swelling, SOB or lightheadedness with hypotension: No ?Has patient had a PCN reaction causing severe rash involving mucus membranes or skin necrosis: No ?Has patient had a PCN reaction that required hospitalization: No ?Has patient had a PCN reaction occurring within the last 10 years: Yes ?If all of the above answers are "NO", then may proceed with Cephalosporin use.  ? ? ? ?  Past Medical History:  ?Diagnosis Date  ? Arthritis   ? Hypertension   ? Pulmonary embolism (HCC)   ? ? ? ?Past Surgical History:  ?Procedure Laterality Date  ? c2 - 3 vertebrae fuse    ? JOINT REPLACEMENT    ?  left hip replacement     ? REPLACEMENT TOTAL KNEE BILATERAL    ? right shoulder replacement    ? ? ?Social History  ? ?Socioeconomic History  ? Marital status: Married  ?  Spouse name: Myrene BuddyYvonne  ? Number of children: Not on file  ? Years of education: Not on file  ? Highest education level: Not on file  ?Occupational History  ? Occupation: retired  ?Tobacco Use  ? Smoking status: Former  ?  Types: Cigarettes  ?  Quit date: 05/05/1978  ?  Years since quitting: 43.4  ? Smokeless tobacco: Never  ? Tobacco comments:  ?  smoked 1 pack/week for about 10 years   ?Vaping Use  ? Vaping Use: Never used  ?Substance and Sexual Activity  ? Alcohol use: Yes  ?  Comment: occasionally  ? Drug use: Never  ? Sexual activity: Not on file  ?Other Topics Concern  ? Not on file  ?Social History Narrative  ? Not on file  ? ?Social Determinants of Health  ? ?Financial Resource Strain: Not on file  ?Food Insecurity: Not on file  ?Transportation Needs: Not on file  ?Physical Activity: Not on file  ?Stress: Not on file  ?Social Connections: Not on file  ?Intimate Partner Violence: Not on file  ? ? ?Family History  ?Problem Relation Age of Onset  ? Arthritis Mother   ? Heart Problems Mother   ? Heart Problems Father   ? Lung cancer Brother   ? Diabetes Brother   ? Kidney disease Sister   ? ? ? ?Current Outpatient Medications:  ?  Dextran 70-Hypromellose 0.1-0.3 % SOLN, Place 1 drop into both eyes every 4 (four) hours as needed (for dry eyes). , Disp: , Rfl:  ?  latanoprost (XALATAN) 0.005 % ophthalmic solution, Place 1 drop into the right eye at bedtime. , Disp: , Rfl:  ?  lisinopril-hydrochlorothiazide (PRINZIDE,ZESTORETIC) 10-12.5 MG tablet, Take 1 tablet by mouth daily., Disp: , Rfl:  ?  polyethylene glycol powder (GLYCOLAX/MIRALAX) 17 GM/SCOOP powder, Take by mouth once. 1 capful bid prn, Disp: , Rfl:  ?  senna (SENOKOT) 8.6 MG tablet, TAKE ONE TABLET BY MOUTH TWO TIMES A DAY FOR CONSTIPATION, Disp: , Rfl:  ?  apixaban (ELIQUIS) 2.5 MG TABS  tablet, Take 1 tablet (2.5 mg total) by mouth 2 (two) times daily., Disp: 180 tablet, Rfl: 1 ? ?Physical exam:  ?Vitals:  ? 09/28/21 0932  ?BP: (!) 124/96  ?Pulse: 60  ?Resp: 20  ?Temp: 98.7 ?F (37.1 ?C)  ?SpO2: 100%  ?Weight: 183 lb 4.8 oz (83.1 kg)  ? ?Physical Exam ?Constitutional:   ?   General: He is not in acute distress. ?HENT:  ?   Head: Normocephalic and atraumatic.  ?Eyes:  ?   General: No scleral icterus. ?   Pupils: Pupils are equal, round, and reactive to light.  ?Cardiovascular:  ?   Rate and Rhythm: Normal rate and regular rhythm.  ?   Heart sounds: Normal heart sounds.  ?Pulmonary:  ?   Effort: Pulmonary effort is normal. No respiratory distress.  ?   Breath sounds: No wheezing.  ?Abdominal:  ?   General: Bowel sounds are normal.  ?  Palpations: Abdomen is soft.  ?Musculoskeletal:     ?   General: No deformity. Normal range of motion.  ?   Cervical back: Normal range of motion and neck supple.  ?Skin: ?   General: Skin is warm and dry.  ?   Findings: No erythema or rash.  ?Neurological:  ?   Mental Status: He is alert and oriented to person, place, and time. Mental status is at baseline.  ?   Cranial Nerves: No cranial nerve deficit.  ?   Coordination: Coordination normal.  ?Psychiatric:     ?   Mood and Affect: Mood normal.  ? ? ? ?  Latest Ref Rng & Units 09/28/2021  ?  9:14 AM  ?CMP  ?Glucose 70 - 99 mg/dL 97    ?BUN 8 - 23 mg/dL 10    ?Creatinine 0.61 - 1.24 mg/dL 3.23    ?Sodium 135 - 145 mmol/L 138    ?Potassium 3.5 - 5.1 mmol/L 4.0    ?Chloride 98 - 111 mmol/L 102    ?CO2 22 - 32 mmol/L 29    ?Calcium 8.9 - 10.3 mg/dL 9.3    ?Total Protein 6.5 - 8.1 g/dL 7.4    ?Total Bilirubin 0.3 - 1.2 mg/dL 0.9    ?Alkaline Phos 38 - 126 U/L 63    ?AST 15 - 41 U/L 20    ?ALT 0 - 44 U/L 14    ? ? ?  Latest Ref Rng & Units 09/28/2021  ?  9:14 AM  ?CBC  ?WBC 4.0 - 10.5 K/uL 3.7    ?Hemoglobin 13.0 - 17.0 g/dL 55.7    ?Hematocrit 39.0 - 52.0 % 45.1    ?Platelets 150 - 400 K/uL 173    ? ?Negative factor V Leiden  mutation and prothrombin mutation. ? ? ?Assessment and plan ?1. History of pulmonary embolism   ?2. History of DVT (deep vein thrombosis)   ? ?#History of DVT and pulmonary embolism. ?Clinically he is doing

## 2022-03-09 IMAGING — CR DG ABDOMEN 2V
1 series · 2 of 2 positions shown · non-contrast
Comparison: None.

CLINICAL DATA: Left lower quadrant abdominal pain

EXAM:
ABDOMEN - 2 VIEW

[Series 1: dg abd 2 views · 0.14mm/px · 2 of 2 slices shown]
[im 1/2]
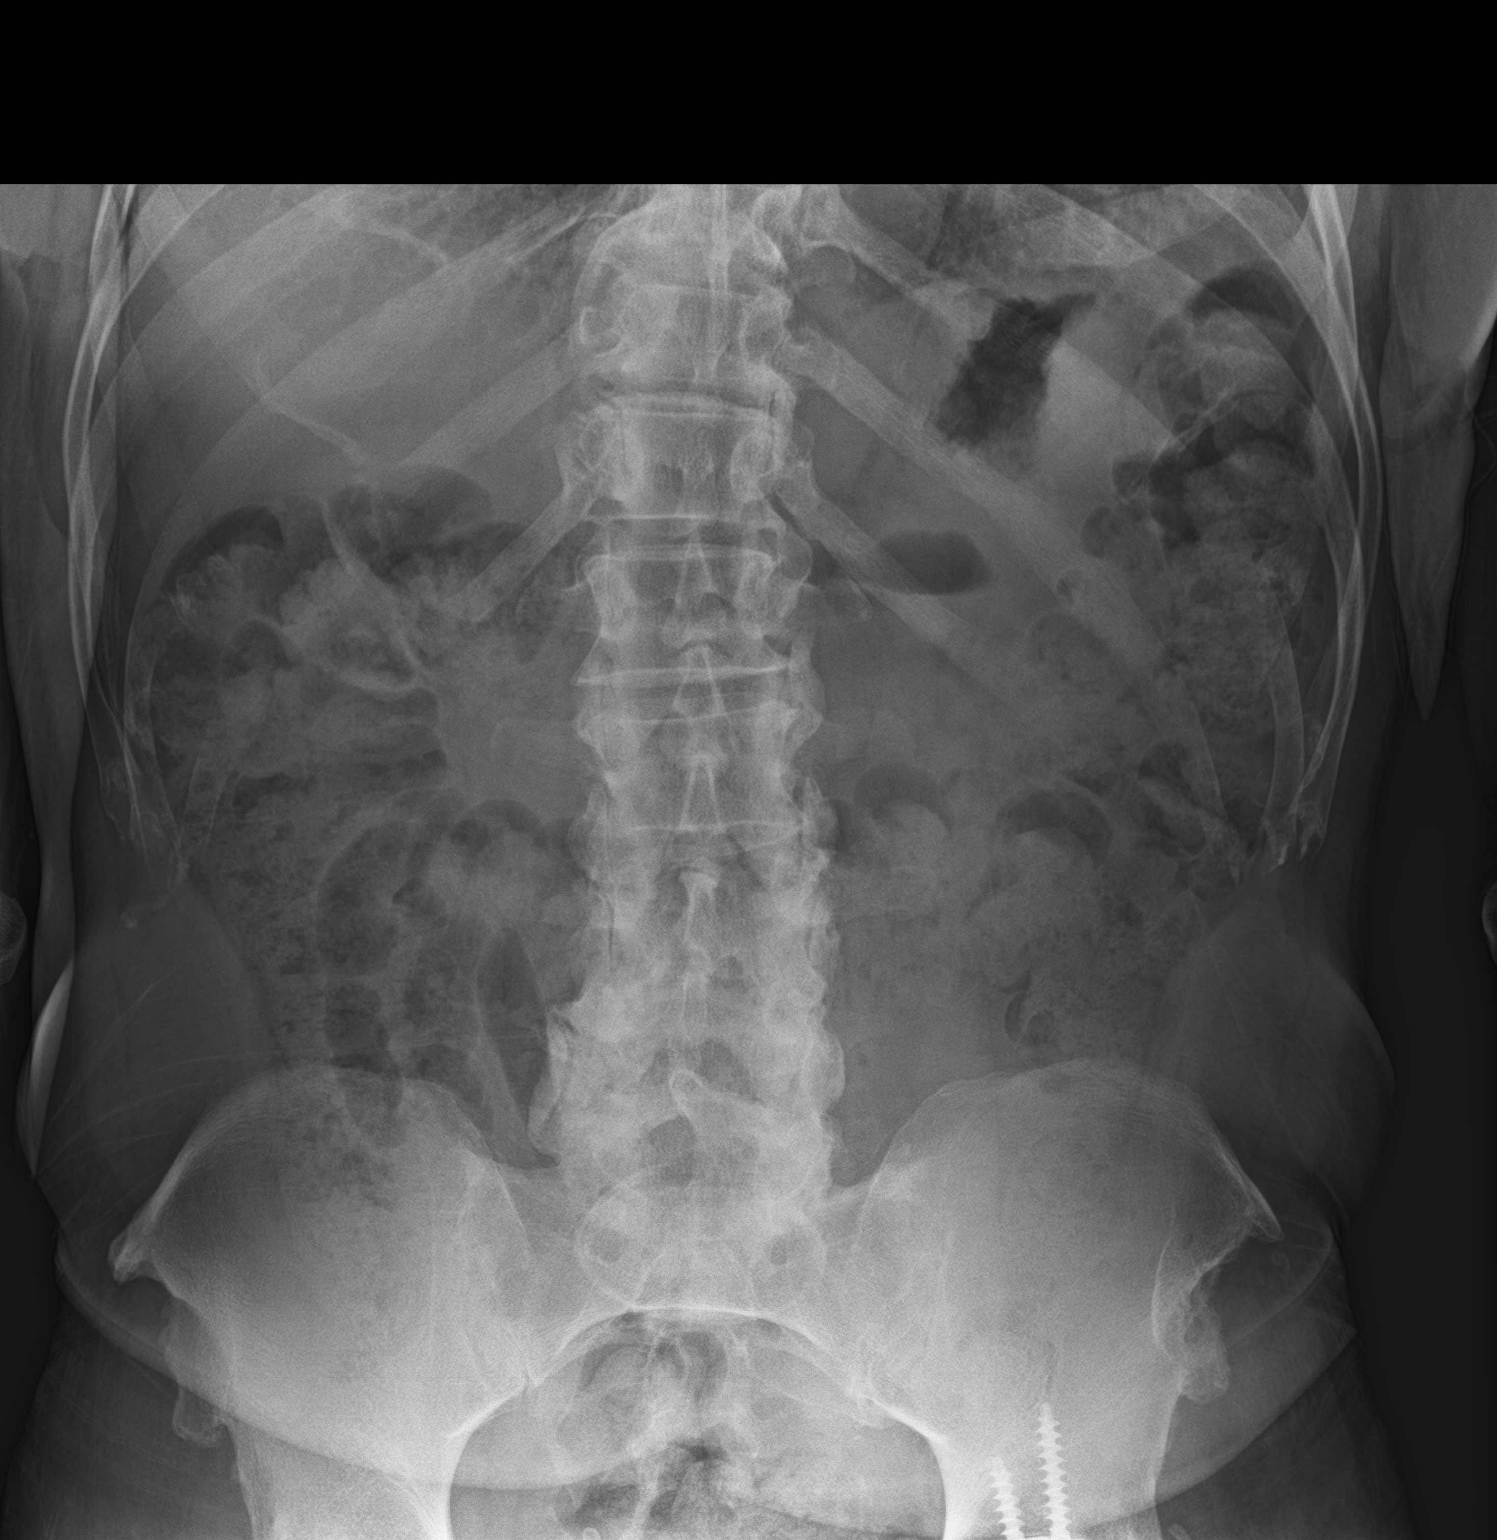
[im 2/2]
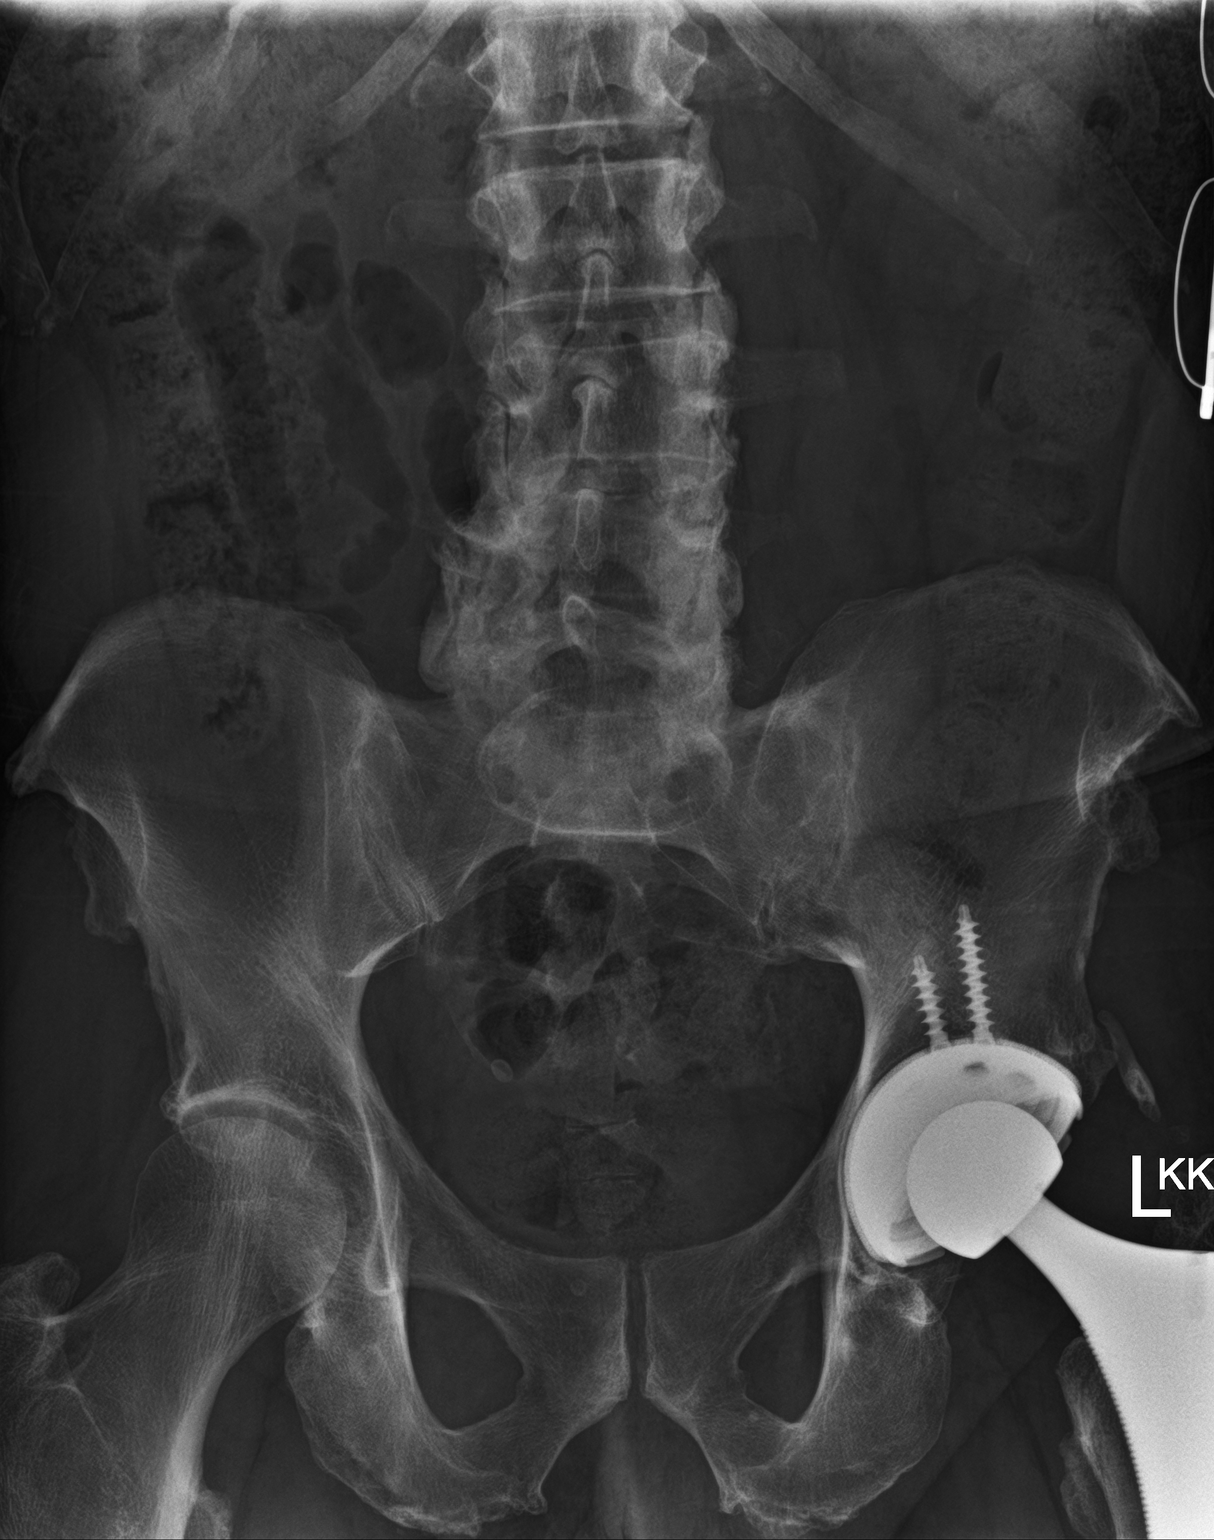

[2 of 2 positions shown; findings below may reference images not displayed]

FINDINGS: Nonobstructive bowel gas pattern. There is no evidence of free air.
Moderate-large volume of stool projects throughout the colon. No
radio-opaque calculi or other significant radiographic abnormality
is seen. Multilevel lumbar spondylosis. Partially visualized left
hip arthroplasty hardware.
IMPRESSION: 1. Nonobstructive bowel gas pattern.
2. Moderate-large volume of stool throughout the colon.

## 2022-04-04 ENCOUNTER — Inpatient Hospital Stay: Payer: Medicare PPO | Attending: Oncology

## 2022-04-04 ENCOUNTER — Inpatient Hospital Stay: Payer: Medicare PPO | Admitting: Oncology

## 2022-04-04 DIAGNOSIS — Z86711 Personal history of pulmonary embolism: Secondary | ICD-10-CM | POA: Insufficient documentation

## 2023-06-17 ENCOUNTER — Emergency Department
Admission: EM | Admit: 2023-06-17 | Discharge: 2023-06-17 | Disposition: A | Discharge status: Home / Self Care | Payer: Medicare PPO | Attending: Emergency Medicine | Admitting: Emergency Medicine

## 2023-06-17 ENCOUNTER — Emergency Department: Payer: Medicare PPO

## 2023-06-17 ENCOUNTER — Other Ambulatory Visit: Payer: Self-pay

## 2023-06-17 DIAGNOSIS — Z5321 Procedure and treatment not carried out due to patient leaving prior to being seen by health care provider: Secondary | ICD-10-CM | POA: Insufficient documentation

## 2023-06-17 DIAGNOSIS — R0789 Other chest pain: Secondary | ICD-10-CM | POA: Diagnosis not present

## 2023-06-17 DIAGNOSIS — R0602 Shortness of breath: Secondary | ICD-10-CM | POA: Diagnosis present

## 2023-06-17 DIAGNOSIS — R059 Cough, unspecified: Secondary | ICD-10-CM | POA: Diagnosis not present

## 2023-06-17 DIAGNOSIS — Z20822 Contact with and (suspected) exposure to covid-19: Secondary | ICD-10-CM | POA: Insufficient documentation

## 2023-06-17 LAB — CBC
HCT: 47.6 % (ref 39.0–52.0)
Hemoglobin: 16.1 g/dL (ref 13.0–17.0)
MCH: 31 pg (ref 26.0–34.0)
MCHC: 33.8 g/dL (ref 30.0–36.0)
MCV: 91.5 fL (ref 80.0–100.0)
Platelets: 163 10*3/uL (ref 150–400)
RBC: 5.2 MIL/uL (ref 4.22–5.81)
RDW: 12.2 % (ref 11.5–15.5)
WBC: 6.3 10*3/uL (ref 4.0–10.5)
nRBC: 0 % (ref 0.0–0.2)

## 2023-06-17 LAB — COMPREHENSIVE METABOLIC PANEL
ALT: 23 U/L (ref 0–44)
AST: 21 U/L (ref 15–41)
Albumin: 4.5 g/dL (ref 3.5–5.0)
Alkaline Phosphatase: 69 U/L (ref 38–126)
Anion gap: 9 (ref 5–15)
BUN: 11 mg/dL (ref 8–23)
CO2: 28 mmol/L (ref 22–32)
Calcium: 9.4 mg/dL (ref 8.9–10.3)
Chloride: 98 mmol/L (ref 98–111)
Creatinine, Ser: 0.9 mg/dL (ref 0.61–1.24)
GFR, Estimated: >60 mL/min (ref >60)
Glucose, Bld: 100 mg/dL — ABNORMAL HIGH (ref 70–99)
Potassium: 3.5 mmol/L (ref 3.5–5.1)
Sodium: 135 mmol/L (ref 135–145)
Total Bilirubin: 1.3 mg/dL — ABNORMAL HIGH (ref <1.2)
Total Protein: 8.1 g/dL (ref 6.5–8.1)

## 2023-06-17 LAB — RESP PANEL BY RT-PCR (RSV, FLU A&B, COVID)  RVPGX2
Influenza A by PCR: NEGATIVE
Influenza B by PCR: NEGATIVE
Resp Syncytial Virus by PCR: NEGATIVE
SARS Coronavirus 2 by RT PCR: NEGATIVE

## 2023-06-17 LAB — APTT: aPTT: 27 s (ref 24–36)

## 2023-06-17 NOTE — ED Triage Notes (Signed)
Pt here with SOB that started a few days ago. Pt states he has been coughing a lot and feels like he can't seem to get his breath when breathing in. Pt states some mild chest tightness. Pt is also on a blood thinner.
